# Patient Record
Sex: Female | Born: 1955 | Race: White | Hispanic: No | Marital: Married | Smoking: Never smoker
Health system: Southern US, Community
[De-identification: ages and names within clinical notes are randomized; demographics above are authoritative.]

## PROBLEM LIST (undated history)

## (undated) DIAGNOSIS — D649 Anemia, unspecified: Secondary | ICD-10-CM

## (undated) DIAGNOSIS — H01006 Unspecified blepharitis left eye, unspecified eyelid: Secondary | ICD-10-CM

## (undated) DIAGNOSIS — J069 Acute upper respiratory infection, unspecified: Secondary | ICD-10-CM

## (undated) DIAGNOSIS — J302 Other seasonal allergic rhinitis: Secondary | ICD-10-CM

## (undated) DIAGNOSIS — M6740 Ganglion, unspecified site: Secondary | ICD-10-CM

## (undated) DIAGNOSIS — M5126 Other intervertebral disc displacement, lumbar region: Secondary | ICD-10-CM

## (undated) HISTORY — PX: LASIK: SHX215

## (undated) HISTORY — PX: SINOSCOPY: SHX187

## (undated) HISTORY — DX: Unspecified blepharitis left eye, unspecified eyelid: H01.006

## (undated) HISTORY — PX: TONSILLECTOMY: SUR1361

## (undated) HISTORY — DX: Acute upper respiratory infection, unspecified: J06.9

---

## 1980-10-28 HISTORY — PX: TONSILLECTOMY: SUR1361

## 1996-10-28 HISTORY — PX: ANTERIOR CRUCIATE LIGAMENT REPAIR: SHX115

## 2001-10-13 ENCOUNTER — Other Ambulatory Visit: Admission: RE | Admit: 2001-10-13 | Discharge: 2001-10-13 | Payer: Self-pay | Admitting: Obstetrics & Gynecology

## 2001-10-23 ENCOUNTER — Ambulatory Visit (HOSPITAL_COMMUNITY): Admission: RE | Admit: 2001-10-23 | Discharge: 2001-10-23 | Payer: Self-pay | Admitting: Obstetrics & Gynecology

## 2001-10-23 ENCOUNTER — Encounter: Payer: Self-pay | Admitting: Obstetrics & Gynecology

## 2001-12-22 ENCOUNTER — Other Ambulatory Visit: Admission: RE | Admit: 2001-12-22 | Discharge: 2001-12-22 | Payer: Self-pay | Admitting: Obstetrics & Gynecology

## 2002-06-29 ENCOUNTER — Other Ambulatory Visit: Admission: RE | Admit: 2002-06-29 | Discharge: 2002-06-29 | Payer: Self-pay | Admitting: Obstetrics & Gynecology

## 2002-10-14 ENCOUNTER — Encounter: Payer: Self-pay | Admitting: Neurology

## 2002-10-14 ENCOUNTER — Ambulatory Visit (HOSPITAL_COMMUNITY): Admission: RE | Admit: 2002-10-14 | Discharge: 2002-10-14 | Payer: Self-pay | Admitting: Neurology

## 2002-10-15 ENCOUNTER — Other Ambulatory Visit: Admission: RE | Admit: 2002-10-15 | Discharge: 2002-10-15 | Payer: Self-pay | Admitting: Obstetrics & Gynecology

## 2003-10-25 ENCOUNTER — Other Ambulatory Visit: Admission: RE | Admit: 2003-10-25 | Discharge: 2003-10-25 | Payer: Self-pay | Admitting: Obstetrics & Gynecology

## 2004-03-08 ENCOUNTER — Other Ambulatory Visit: Admission: RE | Admit: 2004-03-08 | Discharge: 2004-03-08 | Payer: Self-pay | Admitting: Obstetrics and Gynecology

## 2004-08-29 ENCOUNTER — Ambulatory Visit (HOSPITAL_COMMUNITY): Admission: RE | Admit: 2004-08-29 | Discharge: 2004-08-29 | Payer: Self-pay | Admitting: Obstetrics and Gynecology

## 2005-11-22 ENCOUNTER — Ambulatory Visit (HOSPITAL_COMMUNITY): Admission: RE | Admit: 2005-11-22 | Discharge: 2005-11-22 | Payer: Self-pay | Admitting: Obstetrics and Gynecology

## 2005-12-13 ENCOUNTER — Encounter: Admission: RE | Admit: 2005-12-13 | Discharge: 2005-12-13 | Payer: Self-pay | Admitting: Obstetrics and Gynecology

## 2010-08-08 ENCOUNTER — Encounter: Admission: RE | Admit: 2010-08-08 | Discharge: 2010-08-08 | Payer: Self-pay | Admitting: Obstetrics and Gynecology

## 2010-11-18 ENCOUNTER — Encounter: Payer: Self-pay | Admitting: Obstetrics and Gynecology

## 2012-06-30 ENCOUNTER — Other Ambulatory Visit (HOSPITAL_COMMUNITY): Payer: Self-pay | Admitting: Obstetrics and Gynecology

## 2012-06-30 DIAGNOSIS — Z1231 Encounter for screening mammogram for malignant neoplasm of breast: Secondary | ICD-10-CM

## 2012-07-10 ENCOUNTER — Ambulatory Visit (HOSPITAL_COMMUNITY)
Admission: RE | Admit: 2012-07-10 | Discharge: 2012-07-10 | Disposition: A | Discharge status: Home / Self Care | Payer: BC Managed Care – PPO | Source: Ambulatory Visit | Attending: Obstetrics and Gynecology | Admitting: Obstetrics and Gynecology

## 2012-07-10 DIAGNOSIS — Z1231 Encounter for screening mammogram for malignant neoplasm of breast: Secondary | ICD-10-CM

## 2013-05-31 ENCOUNTER — Other Ambulatory Visit (HOSPITAL_COMMUNITY): Payer: Self-pay | Admitting: Obstetrics and Gynecology

## 2013-05-31 DIAGNOSIS — Z1231 Encounter for screening mammogram for malignant neoplasm of breast: Secondary | ICD-10-CM

## 2013-06-24 ENCOUNTER — Other Ambulatory Visit (HOSPITAL_COMMUNITY): Payer: Self-pay | Admitting: Obstetrics and Gynecology

## 2013-06-24 DIAGNOSIS — E2839 Other primary ovarian failure: Secondary | ICD-10-CM

## 2013-07-15 ENCOUNTER — Ambulatory Visit (HOSPITAL_COMMUNITY)
Admission: RE | Admit: 2013-07-15 | Discharge: 2013-07-15 | Disposition: A | Discharge status: Home / Self Care | Payer: BC Managed Care – PPO | Source: Ambulatory Visit | Attending: Obstetrics and Gynecology | Admitting: Obstetrics and Gynecology

## 2013-07-15 DIAGNOSIS — Z13820 Encounter for screening for osteoporosis: Secondary | ICD-10-CM | POA: Insufficient documentation

## 2013-07-15 DIAGNOSIS — Z1231 Encounter for screening mammogram for malignant neoplasm of breast: Secondary | ICD-10-CM | POA: Insufficient documentation

## 2013-07-15 DIAGNOSIS — E2839 Other primary ovarian failure: Secondary | ICD-10-CM

## 2013-07-15 DIAGNOSIS — Z780 Asymptomatic menopausal state: Secondary | ICD-10-CM | POA: Insufficient documentation

## 2014-04-15 ENCOUNTER — Encounter: Payer: Self-pay | Admitting: Gastroenterology

## 2014-05-18 ENCOUNTER — Ambulatory Visit (AMBULATORY_SURGERY_CENTER): Payer: Self-pay | Admitting: *Deleted

## 2014-05-18 VITALS — Ht 67.0 in | Wt 168.2 lb

## 2014-05-18 DIAGNOSIS — Z1211 Encounter for screening for malignant neoplasm of colon: Secondary | ICD-10-CM

## 2014-05-18 MED ORDER — MOVIPREP 100 G PO SOLR: ORAL | Status: DC

## 2014-05-18 NOTE — Progress Notes (Signed)
Patient denies any allergies to eggs or soy. Patient denies any problems with anesthesia/sedation. Patient denies any oxygen use at home and does not take any diet/weight loss medications. EMMI education assisgned to patient on colonoscopy, this was explained and instructions given to patient. 

## 2014-05-20 ENCOUNTER — Encounter: Payer: Self-pay | Admitting: Gastroenterology

## 2014-05-30 ENCOUNTER — Encounter: Payer: BC Managed Care – PPO | Admitting: Gastroenterology

## 2014-08-22 ENCOUNTER — Other Ambulatory Visit: Payer: Self-pay | Admitting: Surgical

## 2014-08-22 NOTE — Progress Notes (Signed)
Please put orders in Epic surgery 09-01-14 pre op 08-26-14 Thanks

## 2014-08-23 ENCOUNTER — Encounter (HOSPITAL_COMMUNITY): Payer: Self-pay | Admitting: Pharmacy Technician

## 2014-08-26 ENCOUNTER — Encounter (HOSPITAL_COMMUNITY)
Admission: RE | Admit: 2014-08-26 | Discharge: 2014-08-26 | Disposition: A | Discharge status: Home / Self Care | Payer: BC Managed Care – PPO | Source: Ambulatory Visit | Attending: Orthopedic Surgery | Admitting: Orthopedic Surgery

## 2014-08-26 ENCOUNTER — Encounter (INDEPENDENT_AMBULATORY_CARE_PROVIDER_SITE_OTHER): Payer: Self-pay

## 2014-08-26 ENCOUNTER — Ambulatory Visit (HOSPITAL_COMMUNITY)
Admission: RE | Admit: 2014-08-26 | Discharge: 2014-08-26 | Disposition: A | Discharge status: Home / Self Care | Payer: BC Managed Care – PPO | Source: Ambulatory Visit | Attending: Surgical | Admitting: Surgical

## 2014-08-26 ENCOUNTER — Encounter (HOSPITAL_COMMUNITY): Payer: Self-pay

## 2014-08-26 DIAGNOSIS — Z01810 Encounter for preprocedural cardiovascular examination: Secondary | ICD-10-CM | POA: Insufficient documentation

## 2014-08-26 DIAGNOSIS — M419 Scoliosis, unspecified: Secondary | ICD-10-CM | POA: Insufficient documentation

## 2014-08-26 DIAGNOSIS — R001 Bradycardia, unspecified: Secondary | ICD-10-CM | POA: Diagnosis not present

## 2014-08-26 DIAGNOSIS — M545 Low back pain, unspecified: Secondary | ICD-10-CM

## 2014-08-26 DIAGNOSIS — Z01811 Encounter for preprocedural respiratory examination: Secondary | ICD-10-CM | POA: Diagnosis not present

## 2014-08-26 DIAGNOSIS — Z01812 Encounter for preprocedural laboratory examination: Secondary | ICD-10-CM | POA: Diagnosis not present

## 2014-08-26 HISTORY — DX: Other intervertebral disc displacement, lumbar region: M51.26

## 2014-08-26 HISTORY — DX: Anemia, unspecified: D64.9

## 2014-08-26 HISTORY — DX: Other seasonal allergic rhinitis: J30.2

## 2014-08-26 HISTORY — DX: Ganglion, unspecified site: M67.40

## 2014-08-26 LAB — COMPREHENSIVE METABOLIC PANEL
ALT: 25 U/L (ref 0–35)
AST: 23 U/L (ref 0–37)
Albumin: 3.9 g/dL (ref 3.5–5.2)
Alkaline Phosphatase: 74 U/L (ref 39–117)
Anion gap: 11 (ref 5–15)
BUN: 22 mg/dL (ref 6–23)
CO2: 29 mEq/L (ref 19–32)
Calcium: 9.5 mg/dL (ref 8.4–10.5)
Chloride: 102 mEq/L (ref 96–112)
Creatinine, Ser: 0.91 mg/dL (ref 0.50–1.10)
GFR calc Af Amer: 80 mL/min — ABNORMAL LOW (ref >90)
GFR calc non Af Amer: 69 mL/min — ABNORMAL LOW (ref >90)
Glucose, Bld: 103 mg/dL — ABNORMAL HIGH (ref 70–99)
Potassium: 4.2 mEq/L (ref 3.7–5.3)
Sodium: 142 mEq/L (ref 137–147)
Total Bilirubin: 0.4 mg/dL (ref 0.3–1.2)
Total Protein: 7 g/dL (ref 6.0–8.3)

## 2014-08-26 LAB — CBC
HEMATOCRIT: 39.3 % (ref 36.0–46.0)
HEMOGLOBIN: 12.9 g/dL (ref 12.0–15.0)
MCH: 30.1 pg (ref 26.0–34.0)
MCHC: 32.8 g/dL (ref 30.0–36.0)
MCV: 91.8 fL (ref 78.0–100.0)
Platelets: 217 10*3/uL (ref 150–400)
RBC: 4.28 MIL/uL (ref 3.87–5.11)
RDW: 12.5 % (ref 11.5–15.5)
WBC: 4.8 10*3/uL (ref 4.0–10.5)

## 2014-08-26 LAB — URINALYSIS, ROUTINE W REFLEX MICROSCOPIC
Bilirubin Urine: NEGATIVE
Glucose, UA: NEGATIVE mg/dL
Ketones, ur: NEGATIVE mg/dL
Leukocytes, UA: NEGATIVE
Nitrite: NEGATIVE
Protein, ur: NEGATIVE mg/dL
Specific Gravity, Urine: 1.027 (ref 1.005–1.030)
Urobilinogen, UA: 0.2 mg/dL (ref 0.0–1.0)
pH: 5.5 (ref 5.0–8.0)

## 2014-08-26 LAB — URINE MICROSCOPIC-ADD ON

## 2014-08-26 LAB — SURGICAL PCR SCREEN
MRSA, PCR: NEGATIVE
Staphylococcus aureus: POSITIVE — AB

## 2014-08-26 LAB — APTT: aPTT: 33 seconds (ref 24–37)

## 2014-08-26 LAB — PROTIME-INR
INR: 1 (ref 0.00–1.49)
Prothrombin Time: 13.3 seconds (ref 11.6–15.2)

## 2014-08-26 NOTE — Patient Instructions (Addendum)
Olivia Cole  08/26/2014   Your procedure is scheduled on:   09-01-2014 Thursday  [facility] and [facility]. Arrive at    1:00 PM.  Call this number if you have problems the morning of surgery: [phone]  Or Presurgical Testing [phone].   For Living Will and/or Health Care Power Attorney Forms: please provide copy for your medical record,may bring AM of surgery(Forms should be already notarized -we do not provide this service).(08-26-14 Yes, will provide copies AM of 09-01-14).     Do not eat food/ or drink: After Midnight.  Exception: may have clear liquids:up to 6 Hours before arrival. Nothing after: 0900 AM  Clear liquids include soda, tea, black coffee, apple or grape juice, broth.  Take these medicines the morning of surgery with A SIP OF WATER: Hydrocodone. Tylenol if needed.   Do not wear jewelry, make-up or nail polish.  Do not wear deodorant, lotions, powders, or perfumes.   Do not shave legs and under arms- 48 hours(2 days) prior to first CHG shower.(Shaving face and neck okay.)  Do not bring valuables to the hospital.(Hospital is not responsible for lost valuables).  Contacts, dentures or removable bridgework, body piercing, hair pins may not be worn into surgery.  Leave suitcase in the car. After surgery it may be brought to your room.  For patients admitted to the hospital, checkout time is 11:00 AM the day of discharge.(Restricted visitors-Any Persons displaying flu-like symptoms or illness).    Patients discharged the day of surgery will not be allowed to drive home. Must have responsible person with you x 24 hours once discharged.  Name and phone number of your driver: Aldona BarLewis Pitts, spouse [phone] cell     Please read over the following fact sheets that you were given:  CHG(Chlorhexidine Gluconate 4% Surgical Soap) use, MRSA Information, Incentive Spirometry Instruction.           [place] -  Preparing for Surgery Before surgery, you can play an important role.  Because skin is not sterile, your skin needs to be as free of germs as possible.  You can reduce the number of germs on your skin by washing with CHG (chlorahexidine gluconate) soap before surgery.  CHG is an antiseptic cleaner which kills germs and bonds with the skin to continue killing germs even after washing. Please DO NOT use if you have an allergy to CHG or antibacterial soaps.  If your skin becomes reddened/irritated stop using the CHG and inform your nurse when you arrive at Short Stay. Do not shave (including legs and underarms) for at least 48 hours prior to the first CHG shower.  You may shave your face/neck. Please follow these instructions carefully:  1.  Shower with CHG Soap the night before surgery and the  morning of Surgery.  2.  If you choose to wash your hair, wash your hair first as usual with your  normal  shampoo.  3.  After you shampoo, rinse your hair and body thoroughly to remove the  shampoo.                           4.  Use CHG as you would any other liquid soap.  You can apply chg directly  to the skin and wash  Gently with a scrungie or clean washcloth.  5.  Apply the CHG Soap to your body ONLY FROM THE NECK DOWN.   Do not use on face/ open                           Wound or open sores. Avoid contact with eyes, ears mouth and genitals (private parts).                       Wash face,  Genitals (private parts) with your normal soap.             6.  Wash thoroughly, paying special attention to the area where your surgery  will be performed.  7.  Thoroughly rinse your body with warm water from the neck down.  8.  DO NOT shower/wash with your normal soap after using and rinsing off  the CHG Soap.                9.  Pat yourself dry with a clean towel.            10.  Wear clean pajamas.            11.  Place clean sheets on your bed the night of your first shower and do not  sleep  with pets. Day of Surgery : Do not apply any lotions/deodorants the morning of surgery.  Please wear clean clothes to the hospital/surgery center.  FAILURE TO FOLLOW THESE INSTRUCTIONS MAY RESULT IN THE CANCELLATION OF YOUR SURGERY PATIENT SIGNATURE_________________________________  NURSE SIGNATURE__________________________________  ________________________________________________________________________   Adam Phenix  An incentive spirometer is a tool that can help keep your lungs clear and active. This tool measures how well you are filling your lungs with each breath. Taking long deep breaths may help reverse or decrease the chance of developing breathing (pulmonary) problems (especially infection) following:  A long period of time when you are unable to move or be active. BEFORE THE PROCEDURE   If the spirometer includes an indicator to show your best effort, your nurse or respiratory therapist will set it to a desired goal.  If possible, sit up straight or lean slightly forward. Try not to slouch.  Hold the incentive spirometer in an upright position. INSTRUCTIONS FOR USE  1. Sit on the edge of your bed if possible, or sit up as far as you can in bed or on a chair. 2. Hold the incentive spirometer in an upright position. 3. Breathe out normally. 4. Place the mouthpiece in your mouth and seal your lips tightly around it. 5. Breathe in slowly and as deeply as possible, raising the piston or the ball toward the top of the column. 6. Hold your breath for 3-5 seconds or for as long as possible. Allow the piston or ball to fall to the bottom of the column. 7. Remove the mouthpiece from your mouth and breathe out normally. 8. Rest for a few seconds and repeat Steps 1 through 7 at least 10 times every 1-2 hours when you are awake. Take your time and take a few normal breaths between deep breaths. 9. The spirometer may include an indicator to show your best effort. Use the  indicator as a goal to work toward during each repetition. 10. After each set of 10 deep breaths, practice coughing to be sure your lungs are clear. If you have an incision (the cut made at the time of  surgery), support your incision when coughing by placing a pillow or rolled up towels firmly against it. Once you are able to get out of bed, walk around indoors and cough well. You may stop using the incentive spirometer when instructed by your caregiver.  RISKS AND COMPLICATIONS  Take your time so you do not get dizzy or light-headed.  If you are in pain, you may need to take or ask for pain medication before doing incentive spirometry. It is harder to take a deep breath if you are having pain. AFTER USE  Rest and breathe slowly and easily.  It can be helpful to keep track of a log of your progress. Your caregiver can provide you with a simple table to help with this. If you are using the spirometer at home, follow these instructions: [place] IF:   You are having difficultly using the spirometer.  You have trouble using the spirometer as often as instructed.  Your pain medication is not giving enough relief while using the spirometer.  You develop fever of 100.5 F (38.1 C) or higher. SEEK IMMEDIATE MEDICAL CARE IF:   You cough up bloody sputum that had not been present before.  You develop fever of 102 F (38.9 C) or greater.  You develop worsening pain at or near the incision site. MAKE SURE YOU:   Understand these instructions.  Will watch your condition.  Will get help right away if you are not doing well or get worse. Document Released: 02/24/2007 Document Revised: 01/06/2012 Document Reviewed: 04/27/2007 [facility] Patient Information 2014 [place], [place].   ________________________________________________________________________

## 2014-08-26 NOTE — Pre-Procedure Instructions (Signed)
08-26-14 EKG done today. Back Xray done today.

## 2014-08-26 NOTE — Progress Notes (Signed)
Ua, micro results faxed by epic to dr Darrelyn Hillockgioffre

## 2014-08-30 NOTE — H&P (Signed)
Olivia Cole is an 58 y.o. female.   Chief Complaint: back pain HPI: The patient presented with the chief complaint of low back pain. She has had this pain for about 2 months with no known injury. She developed pain and weakness in the left LE. MRI showed a lumbar disc herniation at L5-S1 on the left.   Past Medical History  Diagnosis Date  . Seasonal allergies     rare  . Ganglion cyst     right anterior hand-no problem  . Anemia     past hx. anemia  . Lumbar herniated disc     L5-S1    Past Surgical History  Procedure Laterality Date  . Tonsillectomy  1982  . Cesarean section  1988  . Anterior cruciate ligament repair Left 1998  . Lasik      Family History  Problem Relation Age of Onset  . Colon cancer Neg Hx    Social History:  reports that she has never smoked. She has never used smokeless tobacco. She reports that she drinks about 4.2 oz of alcohol per week. She reports that she does not use illicit drugs.  Allergies: No Known Allergies   Current outpatient prescriptions:  calcium carbonate (TUMS EX) 750 MG chewable tablet, Chew 1 tablet by mouth every morning. , Disp: , Rfl: ;   Cholecalciferol (VITAMIN D PO), Take 1 tablet by mouth every morning. , Disp: , Rfl: ;   GLUCOSAMINE HCL PO, Take 1 tablet by mouth every morning. , Disp: , Rfl:  HYDROcodone-acetaminophen (NORCO/VICODIN) 5-325 MG per tablet, Take 1 tablet by mouth every 8 (eight) hours as needed for moderate pain., Disp: , Rfl: ;   ibuprofen (ADVIL,MOTRIN) 200 MG tablet, Take 400-600 mg by mouth every 4 (four) hours as needed for headache or mild pain., Disp: , Rfl: ;   Multiple Vitamin (MULTIVITAMIN) tablet, Take 1 tablet by mouth every morning. , Disp: , Rfl:   Review of Systems  Constitutional: Negative.   HENT: Negative.   Eyes: Negative.   Respiratory: Negative.   Cardiovascular: Negative.   Gastrointestinal: Negative.   Genitourinary: Negative.   Musculoskeletal: Positive for back pain.  Negative for myalgias, joint pain, falls and neck pain.  Skin: Negative.   Neurological: Positive for tingling and sensory change. Negative for dizziness, tremors, speech change, focal weakness, seizures and loss of consciousness.  Endo/Heme/Allergies: Negative.   Psychiatric/Behavioral: Negative.     Physical Exam  Constitutional: She is oriented to person, place, and time. She appears well-developed and well-nourished. No distress.  HENT:  Head: Normocephalic and atraumatic.  Right Ear: External ear normal.  Left Ear: External ear normal.  Nose: Nose normal.  Mouth/Throat: Oropharynx is clear and moist.  Eyes: Conjunctivae and EOM are normal.  Neck: Normal range of motion. Neck supple.  Cardiovascular: Normal rate, regular rhythm, normal heart sounds and intact distal pulses.   No murmur heard. Respiratory: Effort normal and breath sounds normal. No respiratory distress. She has no wheezes.  GI: Soft. Bowel sounds are normal. She exhibits no distension. There is no tenderness.  Musculoskeletal:       Right hip: Normal.       Left hip: Normal.       Right knee: Normal.       Left knee: Normal.       Lumbar back: She exhibits decreased range of motion, tenderness, pain and spasm.       Right lower leg: She exhibits no tenderness and no swelling.  Left lower leg: She exhibits no tenderness and no swelling.  Neurological: She is alert and oriented to person, place, and time. She has normal reflexes. A sensory deficit is present.  Positive SLR on the left. She has weakness of her dorsiflexors and toe extensors of her left foot.  Skin: No rash noted. She is not diaphoretic. No erythema.  Psychiatric: She has a normal mood and affect. Her behavior is normal.     Assessment/Plan Lumbar disc herniation L5-S1 left She needs a lumbar hemilaminectomy and microdiscectomy L5-S1 left. The possible complications of spinal surgery number one could be infection, which is extremely rare. We  do use antibiotics prior to the surgery and during surgery and after surgery. Number two is always a slight degree of probability that you could develop a blood clot in your leg after any type of surgery and we try our best to prevent that with aspirin post op when it is safe to begin. The third is a dural leak. That is the spinal fluid leak that could occur. At certain rare times the bone or the disc could literally stick to the dura which is the lining which contains the spinal fluid and we could develop a small tear in that lining which we then patch up. That is an extremely rare complication. The last and final complication is a recurrent disc rupture. That means that you could rupture another small piece of disc later on down the road and there is about a 2% chance of that.  H&P performed by Dr. Ranee Gosselinonald Gioffre H&P documented by Olivia PedAmber Persis Graffius, PA-C   Olivia Cole Olivia Cole 08/30/2014, 8:42 AM

## 2014-09-01 ENCOUNTER — Observation Stay (HOSPITAL_COMMUNITY)
Admission: RE | Admit: 2014-09-01 | Discharge: 2014-09-02 | Disposition: A | Discharge status: Home / Self Care | Payer: BC Managed Care – PPO | Source: Ambulatory Visit | Attending: Orthopedic Surgery | Admitting: Orthopedic Surgery

## 2014-09-01 ENCOUNTER — Ambulatory Visit (HOSPITAL_COMMUNITY): Payer: BC Managed Care – PPO

## 2014-09-01 ENCOUNTER — Encounter (HOSPITAL_COMMUNITY): Payer: Self-pay | Admitting: Certified Registered Nurse Anesthetist

## 2014-09-01 ENCOUNTER — Ambulatory Visit (HOSPITAL_COMMUNITY): Payer: BC Managed Care – PPO | Admitting: Anesthesiology

## 2014-09-01 ENCOUNTER — Encounter (HOSPITAL_COMMUNITY)
Admission: RE | Disposition: A | Discharge status: Home / Self Care | Payer: Self-pay | Source: Ambulatory Visit | Attending: Orthopedic Surgery

## 2014-09-01 DIAGNOSIS — M5127 Other intervertebral disc displacement, lumbosacral region: Secondary | ICD-10-CM | POA: Diagnosis not present

## 2014-09-01 DIAGNOSIS — M5126 Other intervertebral disc displacement, lumbar region: Secondary | ICD-10-CM

## 2014-09-01 HISTORY — PX: LUMBAR LAMINECTOMY/DECOMPRESSION MICRODISCECTOMY: SHX5026

## 2014-09-01 SURGERY — LUMBAR LAMINECTOMY/DECOMPRESSION MICRODISCECTOMY
Anesthesia: General | Site: Back | Laterality: Left

## 2014-09-01 MED ORDER — PHENYLEPHRINE HCL 10 MG/ML IJ SOLN
INTRAMUSCULAR | Status: AC
  Filled 2014-09-01: qty 1

## 2014-09-01 MED ORDER — MUPIROCIN 2 % EX OINT
1.0000 "application " | TOPICAL_OINTMENT | Freq: Two times a day (BID) | CUTANEOUS | Status: DC
  Administered 2014-09-01: 1 via NASAL

## 2014-09-01 MED ORDER — SENNOSIDES-DOCUSATE SODIUM 8.6-50 MG PO TABS: 1.0000 | ORAL_TABLET | Freq: Every evening | ORAL | Status: DC | PRN

## 2014-09-01 MED ORDER — CEFAZOLIN SODIUM-DEXTROSE 2-3 GM-% IV SOLR
2.0000 g | INTRAVENOUS | Status: AC
Start: 2014-09-02 — End: 2014-09-01
  Administered 2014-09-01: 2 g via INTRAVENOUS

## 2014-09-01 MED ORDER — BACITRACIN-NEOMYCIN-POLYMYXIN 400-5-5000 EX OINT
TOPICAL_OINTMENT | CUTANEOUS | Status: AC
  Filled 2014-09-01: qty 1

## 2014-09-01 MED ORDER — BISACODYL 5 MG PO TBEC: 5.0000 mg | DELAYED_RELEASE_TABLET | Freq: Every day | ORAL | Status: DC | PRN

## 2014-09-01 MED ORDER — ONDANSETRON HCL 4 MG/2ML IJ SOLN: 4.0000 mg | INTRAMUSCULAR | Status: DC | PRN

## 2014-09-01 MED ORDER — SODIUM CHLORIDE 0.9 % IJ SOLN
INTRAMUSCULAR | Status: AC
  Filled 2014-09-01: qty 10

## 2014-09-01 MED ORDER — HYDROMORPHONE HCL 1 MG/ML IJ SOLN
INTRAMUSCULAR | Status: AC
  Filled 2014-09-01: qty 1

## 2014-09-01 MED ORDER — BACITRACIN-NEOMYCIN-POLYMYXIN 400-5-5000 EX OINT
TOPICAL_OINTMENT | CUTANEOUS | Status: DC | PRN
  Administered 2014-09-01: 1 via TOPICAL

## 2014-09-01 MED ORDER — METHOCARBAMOL 500 MG PO TABS
500.0000 mg | ORAL_TABLET | Freq: Four times a day (QID) | ORAL | Status: DC | PRN
  Administered 2014-09-02 (×2): 500 mg via ORAL
  Filled 2014-09-01 (×2): qty 1

## 2014-09-01 MED ORDER — MIDAZOLAM HCL 5 MG/5ML IJ SOLN
INTRAMUSCULAR | Status: DC | PRN
  Administered 2014-09-01: 2 mg via INTRAVENOUS

## 2014-09-01 MED ORDER — ACETAMINOPHEN 10 MG/ML IV SOLN
1000.0000 mg | Freq: Once | INTRAVENOUS | Status: AC
  Administered 2014-09-01: 1000 mg via INTRAVENOUS
  Filled 2014-09-01: qty 100

## 2014-09-01 MED ORDER — FENTANYL CITRATE 0.05 MG/ML IJ SOLN
INTRAMUSCULAR | Status: AC
  Filled 2014-09-01: qty 5

## 2014-09-01 MED ORDER — CEFAZOLIN SODIUM 1-5 GM-% IV SOLN
1.0000 g | Freq: Three times a day (TID) | INTRAVENOUS | Status: AC
  Administered 2014-09-01 – 2014-09-02 (×3): 1 g via INTRAVENOUS
  Filled 2014-09-01 (×3): qty 50

## 2014-09-01 MED ORDER — ONDANSETRON HCL 4 MG/2ML IJ SOLN: 4.0000 mg | Freq: Once | INTRAMUSCULAR | Status: DC | PRN

## 2014-09-01 MED ORDER — GLYCOPYRROLATE 0.2 MG/ML IJ SOLN
INTRAMUSCULAR | Status: DC | PRN
  Administered 2014-09-01: 0.2 mg via INTRAVENOUS
  Administered 2014-09-01: 0.6 mg via INTRAVENOUS

## 2014-09-01 MED ORDER — THROMBIN 5000 UNITS EX SOLR
CUTANEOUS | Status: AC
  Filled 2014-09-01: qty 10000

## 2014-09-01 MED ORDER — ROCURONIUM BROMIDE 100 MG/10ML IV SOLN
INTRAVENOUS | Status: DC | PRN
  Administered 2014-09-01: 10 mg via INTRAVENOUS
  Administered 2014-09-01: 20 mg via INTRAVENOUS

## 2014-09-01 MED ORDER — FENTANYL CITRATE 0.05 MG/ML IJ SOLN
INTRAMUSCULAR | Status: DC | PRN
  Administered 2014-09-01: 50 ug via INTRAVENOUS
  Administered 2014-09-01: 100 ug via INTRAVENOUS
  Administered 2014-09-01 (×2): 50 ug via INTRAVENOUS

## 2014-09-01 MED ORDER — CEFAZOLIN SODIUM-DEXTROSE 2-3 GM-% IV SOLR
INTRAVENOUS | Status: AC
  Filled 2014-09-01: qty 50

## 2014-09-01 MED ORDER — PHENYLEPHRINE HCL 10 MG/ML IJ SOLN
INTRAMUSCULAR | Status: DC | PRN
  Administered 2014-09-01 (×2): 40 ug via INTRAVENOUS

## 2014-09-01 MED ORDER — HYDROMORPHONE HCL 1 MG/ML IJ SOLN
0.2500 mg | INTRAMUSCULAR | Status: DC | PRN
  Administered 2014-09-01: 0.25 mg via INTRAVENOUS

## 2014-09-01 MED ORDER — MIDAZOLAM HCL 2 MG/2ML IJ SOLN
INTRAMUSCULAR | Status: AC
  Filled 2014-09-01: qty 2

## 2014-09-01 MED ORDER — EPHEDRINE SULFATE 50 MG/ML IJ SOLN
INTRAMUSCULAR | Status: DC | PRN
  Administered 2014-09-01: 10 mg via INTRAVENOUS

## 2014-09-01 MED ORDER — HYDROMORPHONE HCL 1 MG/ML IJ SOLN: 0.5000 mg | INTRAMUSCULAR | Status: DC | PRN

## 2014-09-01 MED ORDER — PHENYLEPHRINE HCL 10 MG/ML IJ SOLN: 10.0000 mg | INTRAVENOUS | Status: DC | PRN

## 2014-09-01 MED ORDER — ROCURONIUM BROMIDE 100 MG/10ML IV SOLN
INTRAVENOUS | Status: AC
  Filled 2014-09-01: qty 1

## 2014-09-01 MED ORDER — LIDOCAINE HCL (CARDIAC) 20 MG/ML IV SOLN
INTRAVENOUS | Status: DC | PRN
  Administered 2014-09-01: 100 mg via INTRAVENOUS

## 2014-09-01 MED ORDER — FLEET ENEMA 7-19 GM/118ML RE ENEM: 1.0000 | ENEMA | Freq: Once | RECTAL | Status: AC | PRN

## 2014-09-01 MED ORDER — NEOSTIGMINE METHYLSULFATE 10 MG/10ML IV SOLN
INTRAVENOUS | Status: DC | PRN
  Administered 2014-09-01: 5 mg via INTRAVENOUS

## 2014-09-01 MED ORDER — METHOCARBAMOL 500 MG PO TABS: 500.0000 mg | ORAL_TABLET | Freq: Four times a day (QID) | ORAL | Status: DC | PRN

## 2014-09-01 MED ORDER — SODIUM CHLORIDE 0.9 % IR SOLN
Status: AC
  Filled 2014-09-01: qty 1

## 2014-09-01 MED ORDER — ESMOLOL HCL 10 MG/ML IV SOLN
INTRAVENOUS | Status: AC
  Filled 2014-09-01: qty 10

## 2014-09-01 MED ORDER — HYDROMORPHONE HCL 1 MG/ML IJ SOLN: 0.2500 mg | INTRAMUSCULAR | Status: DC | PRN

## 2014-09-01 MED ORDER — PROPOFOL 10 MG/ML IV BOLUS
INTRAVENOUS | Status: AC
  Filled 2014-09-01: qty 20

## 2014-09-01 MED ORDER — PHENOL 1.4 % MT LIQD: 1.0000 | OROMUCOSAL | Status: DC | PRN

## 2014-09-01 MED ORDER — OXYCODONE HCL 5 MG/5ML PO SOLN: 5.0000 mg | Freq: Once | ORAL | Status: DC | PRN

## 2014-09-01 MED ORDER — HYDROCODONE-ACETAMINOPHEN 5-325 MG PO TABS
1.0000 | ORAL_TABLET | Freq: Three times a day (TID) | ORAL | Status: DC | PRN
  Administered 2014-09-02 (×2): 1 via ORAL
  Filled 2014-09-01 (×2): qty 1

## 2014-09-01 MED ORDER — LACTATED RINGERS IV SOLN: INTRAVENOUS | Status: DC

## 2014-09-01 MED ORDER — OXYCODONE HCL 5 MG PO TABS
5.0000 mg | ORAL_TABLET | Freq: Once | ORAL | Status: DC | PRN
Start: 2014-09-01 — End: 2014-09-01

## 2014-09-01 MED ORDER — SODIUM CHLORIDE 0.9 % IR SOLN
Status: DC | PRN
  Administered 2014-09-01: 500 mL

## 2014-09-01 MED ORDER — OXYCODONE-ACETAMINOPHEN 5-325 MG PO TABS: 1.0000 | ORAL_TABLET | ORAL | Status: DC | PRN

## 2014-09-01 MED ORDER — GLYCOPYRROLATE 0.2 MG/ML IJ SOLN
INTRAMUSCULAR | Status: AC
  Filled 2014-09-01: qty 3

## 2014-09-01 MED ORDER — LIDOCAINE HCL (CARDIAC) 20 MG/ML IV SOLN
INTRAVENOUS | Status: AC
  Filled 2014-09-01: qty 5

## 2014-09-01 MED ORDER — METHOCARBAMOL 1000 MG/10ML IJ SOLN
500.0000 mg | Freq: Four times a day (QID) | INTRAVENOUS | Status: DC | PRN
  Administered 2014-09-01: 500 mg via INTRAVENOUS
  Filled 2014-09-01 (×2): qty 5

## 2014-09-01 MED ORDER — BUPIVACAINE-EPINEPHRINE 0.5% -1:200000 IJ SOLN
INTRAMUSCULAR | Status: AC
  Filled 2014-09-01: qty 1

## 2014-09-01 MED ORDER — BUPIVACAINE LIPOSOME 1.3 % IJ SUSP
20.0000 mL | Freq: Once | INTRAMUSCULAR | Status: DC
  Filled 2014-09-01: qty 20

## 2014-09-01 MED ORDER — SUCCINYLCHOLINE CHLORIDE 20 MG/ML IJ SOLN
INTRAMUSCULAR | Status: DC | PRN
  Administered 2014-09-01: 100 mg via INTRAVENOUS

## 2014-09-01 MED ORDER — MENTHOL 3 MG MT LOZG
1.0000 | LOZENGE | OROMUCOSAL | Status: DC | PRN
  Filled 2014-09-01: qty 9

## 2014-09-01 MED ORDER — PROPOFOL 10 MG/ML IV BOLUS
INTRAVENOUS | Status: DC | PRN
  Administered 2014-09-01: 150 mg via INTRAVENOUS

## 2014-09-01 MED ORDER — ESMOLOL HCL 10 MG/ML IV SOLN
INTRAVENOUS | Status: DC | PRN
  Administered 2014-09-01: 10 mg via INTRAVENOUS

## 2014-09-01 MED ORDER — ACETAMINOPHEN 325 MG PO TABS: 650.0000 mg | ORAL_TABLET | ORAL | Status: DC | PRN

## 2014-09-01 MED ORDER — MEPERIDINE HCL 50 MG/ML IJ SOLN: 6.2500 mg | INTRAMUSCULAR | Status: DC | PRN

## 2014-09-01 MED ORDER — BUPIVACAINE-EPINEPHRINE 0.5% -1:200000 IJ SOLN
INTRAMUSCULAR | Status: DC | PRN
  Administered 2014-09-01: 20 mL

## 2014-09-01 MED ORDER — PROMETHAZINE HCL 25 MG/ML IJ SOLN: 6.2500 mg | INTRAMUSCULAR | Status: DC | PRN

## 2014-09-01 MED ORDER — ONDANSETRON HCL 4 MG/2ML IJ SOLN
INTRAMUSCULAR | Status: DC | PRN
  Administered 2014-09-01: 4 mg via INTRAVENOUS

## 2014-09-01 MED ORDER — LACTATED RINGERS IV SOLN
INTRAVENOUS | Status: DC
  Administered 2014-09-01 – 2014-09-02 (×3): via INTRAVENOUS

## 2014-09-01 MED ORDER — NEOSTIGMINE METHYLSULFATE 10 MG/10ML IV SOLN
INTRAVENOUS | Status: AC
  Filled 2014-09-01: qty 1

## 2014-09-01 MED ORDER — ACETAMINOPHEN 650 MG RE SUPP: 650.0000 mg | RECTAL | Status: DC | PRN

## 2014-09-01 MED ORDER — BUPIVACAINE LIPOSOME 1.3 % IJ SUSP
INTRAMUSCULAR | Status: DC | PRN
  Administered 2014-09-01: 20 mL

## 2014-09-01 MED ORDER — EPHEDRINE SULFATE 50 MG/ML IJ SOLN
INTRAMUSCULAR | Status: AC
  Filled 2014-09-01: qty 1

## 2014-09-01 MED ORDER — PHENYLEPHRINE 40 MCG/ML (10ML) SYRINGE FOR IV PUSH (FOR BLOOD PRESSURE SUPPORT)
PREFILLED_SYRINGE | INTRAVENOUS | Status: AC
  Filled 2014-09-01: qty 10

## 2014-09-01 MED ORDER — OXYCODONE-ACETAMINOPHEN 5-325 MG PO TABS
1.0000 | ORAL_TABLET | ORAL | Status: DC | PRN
  Administered 2014-09-02: 1 via ORAL
  Filled 2014-09-01: qty 1

## 2014-09-01 MED ORDER — THROMBIN 5000 UNITS EX SOLR
CUTANEOUS | Status: DC | PRN
  Administered 2014-09-01: 10000 [IU] via TOPICAL

## 2014-09-01 MED ORDER — ONDANSETRON HCL 4 MG/2ML IJ SOLN
INTRAMUSCULAR | Status: AC
  Filled 2014-09-01: qty 2

## 2014-09-01 SURGICAL SUPPLY — 45 items
APL SKNCLS STERI-STRIP NONHPOA (GAUZE/BANDAGES/DRESSINGS) ×1
BAG SPEC THK2 15X12 ZIP CLS (MISCELLANEOUS) ×1
BAG ZIPLOCK 12X15 (MISCELLANEOUS) ×2 IMPLANT
BENZOIN TINCTURE PRP APPL 2/3 (GAUZE/BANDAGES/DRESSINGS) ×2 IMPLANT
BLADE SURG SZ10 CARB STEEL (BLADE) IMPLANT
CLEANER TIP ELECTROSURG 2X2 (MISCELLANEOUS) ×2 IMPLANT
DRAIN PENROSE 18X1/4 LTX STRL (WOUND CARE) IMPLANT
DRAPE MICROSCOPE LEICA (MISCELLANEOUS) ×2 IMPLANT
DRAPE POUCH INSTRU U-SHP 10X18 (DRAPES) ×2 IMPLANT
DRAPE SURG 17X11 SM STRL (DRAPES) ×2 IMPLANT
DRSG ADAPTIC 3X8 NADH LF (GAUZE/BANDAGES/DRESSINGS) ×2 IMPLANT
DRSG PAD ABDOMINAL 8X10 ST (GAUZE/BANDAGES/DRESSINGS) ×5 IMPLANT
DURAPREP 26ML APPLICATOR (WOUND CARE) ×2 IMPLANT
ELECT BLADE TIP CTD 4 INCH (ELECTRODE) ×2 IMPLANT
ELECT REM PT RETURN 9FT ADLT (ELECTROSURGICAL) ×2
ELECTRODE REM PT RTRN 9FT ADLT (ELECTROSURGICAL) ×1 IMPLANT
GAUZE SPONGE 4X4 12PLY STRL (GAUZE/BANDAGES/DRESSINGS) ×1 IMPLANT
GLOVE BIOGEL PI IND STRL 8 (GLOVE) ×2 IMPLANT
GLOVE BIOGEL PI INDICATOR 8 (GLOVE) ×2
GLOVE ECLIPSE 8.0 STRL XLNG CF (GLOVE) ×4 IMPLANT
GOWN STRL REUS W/TWL LRG LVL3 (GOWN DISPOSABLE) ×6 IMPLANT
GOWN STRL REUS W/TWL XL LVL3 (GOWN DISPOSABLE) ×4 IMPLANT
KIT BASIN OR (CUSTOM PROCEDURE TRAY) ×2 IMPLANT
KIT POSITIONING SURG ANDREWS (MISCELLANEOUS) ×2 IMPLANT
MANIFOLD NEPTUNE II (INSTRUMENTS) ×2 IMPLANT
NDL SPNL 18GX3.5 QUINCKE PK (NEEDLE) ×2 IMPLANT
NEEDLE SPNL 18GX3.5 QUINCKE PK (NEEDLE) ×4 IMPLANT
NS IRRIG 1000ML POUR BTL (IV SOLUTION) ×2 IMPLANT
PACK LAMINECTOMY ORTHO (CUSTOM PROCEDURE TRAY) ×2 IMPLANT
PATTIES SURGICAL .5 X.5 (GAUZE/BANDAGES/DRESSINGS) IMPLANT
PATTIES SURGICAL .75X.75 (GAUZE/BANDAGES/DRESSINGS) IMPLANT
PATTIES SURGICAL 1X1 (DISPOSABLE) IMPLANT
PIN SAFETY NICK PLATE  2 MED (MISCELLANEOUS)
PIN SAFETY NICK PLATE 2 MED (MISCELLANEOUS) IMPLANT
POSITIONER SURGICAL ARM (MISCELLANEOUS) ×2 IMPLANT
SPONGE LAP 4X18 X RAY DECT (DISPOSABLE) IMPLANT
SPONGE SURGIFOAM ABS GEL 100 (HEMOSTASIS) ×2 IMPLANT
STAPLER VISISTAT 35W (STAPLE) IMPLANT
SUT VIC AB 0 CT1 27 (SUTURE) ×2
SUT VIC AB 0 CT1 27XBRD ANTBC (SUTURE) ×1 IMPLANT
SUT VIC AB 1 CT1 27 (SUTURE) ×8
SUT VIC AB 1 CT1 27XBRD ANTBC (SUTURE) ×4 IMPLANT
TAPE CLOTH SURG 6X10 WHT LF (GAUZE/BANDAGES/DRESSINGS) ×1 IMPLANT
TOWEL OR 17X26 10 PK STRL BLUE (TOWEL DISPOSABLE) ×4 IMPLANT
WATER STERILE IRR 1500ML POUR (IV SOLUTION) ×2 IMPLANT

## 2014-09-01 NOTE — Op Note (Signed)
NAMCandie Mile:  Riemann, Derek             ACCOUNT NO.:  [id number]  MEDICAL RECORD NO.:  [id number]  LOCATION:  1607                         [facility]  PHYSICIAN:  Georges Lynchonald A. Rylen Swindler, M.D.DATE OF BIRTH:  1956-06-07  DATE OF PROCEDURE:  09/01/2014 DATE OF DISCHARGE:                              OPERATIVE REPORT   PREOPERATIVE DIAGNOSES: 1. 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 2. Large herniated disk at L5-S1 on the left.  POSTOPERATIVE DIAGNOSES: 1. 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 2. Large herniated disk at L5-S1 on the left.  OPERATION: 1. Hemilaminectomy and microdiskectomy at L5-S1 on the left. 2. Decompression of the lateral recess at L5-S1 on the left. 3. Foraminotomy for the S1 root on the left.  SURGEON:  Georges Lynchonald A. Darrelyn HillockGioffre, M.D.  ASSISTANT:  Marlowe KaysJames Aplington, M.D.  DESCRIPTION OF PROCEDURE:  Under general anesthesia, routine orthopedic prepping and draping of the lower back was carried out.  The appropriate time-out was first carried out.  I also marked the appropriate left side of her back in the holding area.  All her leg pain was on the left only. At this time, 2 needles were placed in the back after the time-out x-ray was taken to verify the position.  Incision then was made after I injected 20 mL of 0.5% Marcaine with epinephrine on both sides of the spinous process to control the bleeding.  Incision was made down to the L5-S1 area and we extended it proximally.  Bleeders were identified and cauterized.  I then separated the muscle from the lamina and spinous processes bilaterally and another x-ray was taken.  Following that, I then inserted my McCullough retractors and carried out my hemilaminectomy in both directions.  Went out far lateral to decompress the lateral recess, which was extremely tight.  A microscope was brought in.  At this particular time, we gently removed the ligamentum flavum with great care taken to protect the underlying  dura and the S1 root. The S1 root was extremely swollen.  There was extremely large herniated disk.  We gently irrigated the area out, inserted some thrombin-soaked Gelfoam to get good control of the bleeding.  We then did a nice foraminotomy for the S1 root.  Since it was quite swollen.  We gently retracted the root, made a cruciate incision in the posterior longitudinal ligament, and did a microdiskectomy.  Since we made the incision in the posterior longitudinal ligament, the disk literally exploded out.  We utilized nerve hook and the Epstein curettes to complete the diskectomy.  Following this, we irrigated the area, reinspected the dura and that was free.  We were able to easily pass a hockey-stick distally out the S1 root, also proximally for the L5 root. The recess was nicely decompressed.  I thoroughly irrigated out the area loosely, applied some thrombin-soaked Gelfoam, closed the wound in layers in usual fashion except I left a small distal and proximal deep part of the wound open for drainage purposes.  I injected 20 mL of Exparel at the end of the procedure into the muscle and subcu.  The subcu was closed with #1 Vicryl.  The skin was closed with metal staples.  Sterile Neosporin dressing was applied. She had 2 g of IV Ancef preop.          ______________________________ Georges Lynchonald A. Darrelyn HillockGioffre, M.D.     RAG/MEDQ  D:  09/01/2014  T:  09/01/2014  Job:  [id number]

## 2014-09-01 NOTE — Anesthesia Postprocedure Evaluation (Signed)
Anesthesia Post Note  Patient: Olivia Cole  Procedure(s) Performed: Procedure(s) (LRB): HEMI LAMINECTOMY MICRODISCECTOMY L5-S1 LEFT  (Left)  Anesthesia type: General  Patient location: PACU  Post pain: Pain level controlled  Post assessment: Post-op Vital signs reviewed  Last Vitals: BP 152/86 mmHg  Pulse 77  Temp(Src) 36.3 C (Oral)  Resp 16  SpO2 100%  Post vital signs: Reviewed  Level of consciousness: sedated  Complications: No apparent anesthesia complications

## 2014-09-01 NOTE — Anesthesia Preprocedure Evaluation (Addendum)
Anesthesia Evaluation  Patient identified by MRN, date of birth, ID band Patient awake    Reviewed: Allergy & Precautions, H&P , NPO status , Patient's Chart, lab work & pertinent test results  History of Anesthesia Complications Negative for: history of anesthetic complications  Airway Mallampati: II  TM Distance: >3 FB Neck ROM: Full    Dental no notable dental hx.    Pulmonary neg pulmonary ROS,  breath sounds clear to auscultation  Pulmonary exam normal       Cardiovascular Exercise Tolerance: Good negative cardio ROS  Rhythm:Regular Rate:Normal     Neuro/Psych negative neurological ROS  negative psych ROS   GI/Hepatic negative GI ROS, Neg liver ROS,   Endo/Other  negative endocrine ROS  Renal/GU negative Renal ROS  negative genitourinary   Musculoskeletal negative musculoskeletal ROS (+)   Abdominal   Peds negative pediatric ROS (+)  Hematology negative hematology ROS (+)   Anesthesia Other Findings   Reproductive/Obstetrics negative OB ROS                             Anesthesia Physical Anesthesia Plan  ASA: I  Anesthesia Plan: General   Post-op Pain Management:    Induction: Intravenous  Airway Management Planned: Oral ETT  Additional Equipment:   Intra-op Plan:   Post-operative Plan: Extubation in OR  Informed Consent: I have reviewed the patients History and Physical, chart, labs and discussed the procedure including the risks, benefits and alternatives for the proposed anesthesia with the patient or authorized representative who has indicated his/her understanding and acceptance.   Dental advisory given  Plan Discussed with: CRNA  Anesthesia Plan Comments:         Anesthesia Quick Evaluation

## 2014-09-01 NOTE — Interval H&P Note (Signed)
History and Physical Interval Note:  09/01/2014 3:19 PM  Olivia Cole  has presented today for surgery, with the diagnosis of HERNIATED DISC   The various methods of treatment have been discussed with the patient and family. After consideration of risks, benefits and other options for treatment, the patient has consented to  Procedure(s): HEMI LAMINECTOMY MICRODISCECTOMY L5-S1 LEFT  (Left) as a surgical intervention .  The patient's history has been reviewed, patient examined, no change in status, stable for surgery.  I have reviewed the patient's chart and labs.  Questions were answered to the patient's satisfaction.     Jakaleb Payer A

## 2014-09-01 NOTE — Plan of Care (Signed)
Problem: Phase I Progression Outcomes Goal: OOB as tolerated unless otherwise ordered Outcome: Completed/Met Date Met:  09/01/14     

## 2014-09-01 NOTE — Transfer of Care (Signed)
Immediate Anesthesia Transfer of Care Note  Patient: Olivia Cole  Procedure(s) Performed: Procedure(s) (LRB): HEMI LAMINECTOMY MICRODISCECTOMY L5-S1 LEFT  (Left)  Patient Location: PACU  Anesthesia Type: General  Level of Consciousness: sedated, patient cooperative and responds to stimulation  Airway & Oxygen Therapy: Patient Spontanous Breathing and Patient connected to face mask oxgen  Post-op Assessment: Report given to PACU RN and Post -op Vital signs reviewed and stable  Post vital signs: Reviewed and stable  Complications: No apparent anesthesia complications

## 2014-09-01 NOTE — Plan of Care (Signed)
Problem: Phase I Progression Outcomes Goal: Pain controlled with appropriate interventions Outcome: Completed/Met Date Met:  09/01/14

## 2014-09-01 NOTE — Plan of Care (Signed)
Problem: Phase I Progression Outcomes Goal: Hemodynamically stable Outcome: Completed/Met Date Met:  09/01/14     

## 2014-09-01 NOTE — Plan of Care (Signed)
Problem: Phase I Progression Outcomes Goal: Log roll for position change Outcome: Completed/Met Date Met:  09/01/14

## 2014-09-01 NOTE — Brief Op Note (Signed)
09/01/2014  5:43 PM  PATIENT:  Olivia Cole  58 y.o. female  PRE-OPERATIVE DIAGNOSIS:  HERNIATED DISC ,Lumbar at L-5-S-1 on the left and Lateral Recess Stenosis.  POST-OPERATIVE DIAGNOSIS:Same as Pre-op  PROCEDURE:  Procedure(s): HEMI LAMINECTOMY MICRODISCECTOMY L5-S1 LEFT  (Left) and Decompression of Lateral Recess on the Left.  SURGEON:  Surgeon(s) and Role:    * Jacki Conesonald A Jasmeet Gehl, MD - Primary    * Drucilla SchmidtJames P Aplington, MD - Assisting  :   ASSISTANTS:James Aplington MD  ANESTHESIA:   general  EBL:  Total I/O In: 1000 [I.V.:1000] Out: 25 [Blood:25]  BLOOD ADMINISTERED:none  DRAINS: none   LOCAL MEDICATIONS USED:  MARCAINE 20cc of 0.50% with Epinephrine at start of case and 20cc of Exparel at the end of the case.    SPECIMEN:  Source of Specimen:  L-5-S-1 on the Left.  DISPOSITION OF SPECIMEN:  PATHOLOGY  COUNTS:  YES  TOURNIQUET:  * No tourniquets in log *  DICTATION: .Other Dictation: Dictation Number [id number]  PLAN OF CARE: Admit for overnight observation  PATIENT DISPOSITION:  PACU - hemodynamically stable.   Delay start of Pharmacological VTE agent (>24hrs) due to surgical blood loss or risk of bleeding: yes

## 2014-09-01 NOTE — Discharge Instructions (Signed)
Change your dressing daily. °Shower only, no tub bath. °Call if any temperatures greater than 101 or any wound complications: [phone] during the day and ask for Dr. Gioffre's nurse, Tammy Johnson. °

## 2014-09-02 ENCOUNTER — Other Ambulatory Visit: Payer: Self-pay | Admitting: Orthopedic Surgery

## 2014-09-02 ENCOUNTER — Encounter (HOSPITAL_COMMUNITY): Payer: Self-pay | Admitting: Orthopedic Surgery

## 2014-09-02 DIAGNOSIS — M5127 Other intervertebral disc displacement, lumbosacral region: Secondary | ICD-10-CM | POA: Diagnosis not present

## 2014-09-02 NOTE — Evaluation (Signed)
Occupational Therapy Evaluation Patient Details Name: Olivia Cole MRN: [id number] DOB: 1956/05/29 Today's Date: 09/02/2014    History of Present Illness s/p hemilaminectomy, microdiskectomy, decompression L5-S1   Clinical Impression   This 58 year old female was admitted for the above surgery.  OT will return later today to further educate and assess bathroom transfers as pt was limited by lightheadedness at time of evaluation.      Follow Up Recommendations  No OT follow up    Equipment Recommendations   (to be further assessed for 3;1)    Recommendations for Other Services       Precautions / Restrictions Precautions Precautions: Back Restrictions Weight Bearing Restrictions: No      Mobility Bed Mobility Overal bed mobility: Needs Assistance Bed Mobility: Sidelying to Sit;Supine to Sit   Sidelying to sit: Supervision Supine to sit: Supervision     General bed mobility comments: used bed rail lying back down.    Transfers                      Balance                                            ADL Overall ADL's : Needs assistance/impaired                                       General ADL Comments: pt sat EOB and OT educated on alternative ways to perform ADLs following back precautions.  Pt likely to do best from supine position or to have husband assist her.  She had questions about sitting vs. lying down, when she can resume exercises:  told her to check with Dr Darrelyn HillockGioffre about timing to resume activities.  Demonstrated toilet aide--pt not interested in other AE.  Pt is overall set up for UB adls and mod A for LB adls.  She needed to lie back down due to lightheadedness.  Will return for toilet transfer/DME assessment.  Demonstrated sitting backwards on toilet, if needed     Vision                     Perception     Praxis      Pertinent Vitals/Pain       Hand Dominance     Extremity/Trunk  Assessment Upper Extremity Assessment Upper Extremity Assessment: Overall WFL for tasks assessed           Communication Communication Communication: No difficulties   Cognition Arousal/Alertness: Awake/alert Behavior During Therapy: WFL for tasks assessed/performed Overall Cognitive Status: Within Functional Limits for tasks assessed                     General Comments       Exercises       Shoulder Instructions      Home Living Family/patient expects to be discharged to:: Private residence Living Arrangements: Spouse/significant other                 Bathroom Shower/Tub: Producer, television/film/videoWalk-in shower   Bathroom Toilet: Standard                Prior Functioning/Environment Level of Independence: Independent             OT Diagnosis: Generalized  weakness   OT Problem List: Decreased activity tolerance;Decreased knowledge of precautions;Decreased knowledge of use of DME or AE;Pain   OT Treatment/Interventions: Self-care/ADL training;DME and/or AE instruction;Patient/family education    OT Goals(Current goals can be found in the care plan section) Acute Rehab OT Goals Patient Stated Goal: get back to work, bicycling OT Goal Formulation: With patient/family Time For Goal Achievement: 09/09/14 Potential to Achieve Goals: Good ADL Goals Pt Will Transfer to Toilet: with supervision;regular height toilet;bedside commode;ambulating Pt Will Perform Tub/Shower Transfer: with supervision;ambulating  OT Frequency: Min 2X/week   Barriers to D/C:            Co-evaluation              End of Session    Activity Tolerance:  (lightheaded) Patient left: in bed;with call bell/phone within reach;with family/visitor present;with nursing/sitter in room   Time: 0912-0928 OT Time Calculation (min): 16 min Charges:  OT General Charges $OT Visit: 1 Procedure OT Evaluation $Initial OT Evaluation Tier I: 1 Procedure OT Treatments $Self Care/Home Management :  8-22 mins G-Codes: OT G-codes **NOT FOR INPATIENT CLASS** Functional Assessment Tool Used: clinical judgment Functional Limitation: Self care Self Care Current Status ([id number](G8987): At least 40 percent but less than 60 percent impaired, limited or restricted Self Care Goal Status (U0454(G8988): At least 40 percent but less than 60 percent impaired, limited or restricted  [facility] Hospital[facility]ER,Olivia Cole 09/02/2014, 10:22 AM Marica OtterMaryellen [name], OTR/L [phone] 09/02/2014

## 2014-09-02 NOTE — Progress Notes (Signed)
RN reviewed discharge instructions with patient and family. All questions answered.   Paperwork and prescriptions given.   RN rolled patient down to family car.   

## 2014-09-02 NOTE — Progress Notes (Signed)
UR completed 

## 2014-09-02 NOTE — Progress Notes (Signed)
   Subjective: 1 Day Post-Op Procedure(s) (LRB): HEMI LAMINECTOMY MICRODISCECTOMY L5-S1 LEFT  (Left) Patient reports pain as mild.   Patient seen in rounds without Dr. Darrelyn HillockGioffre Patient is well, and has had no acute complaints or problems other than feeling lightheaded. She reports that she has been up and voided on her own. No issues overnight. Husband in room during rounds.  Plan is to go Home after hospital stay.  Objective: Vital signs in last 24 hours: Temp:  [97.4 F (36.3 C)-99 F (37.2 C)] 99 F (37.2 C) (11/06 0604) Pulse Rate:  [60-82] 77 (11/06 0604) Resp:  [14-18] 16 (11/06 0604) BP: (125-152)/(63-97) 133/63 mmHg (11/06 0604) SpO2:  [96 %-100 %] 96 % (11/06 0604) Weight:  [76.318 kg (168 lb 4 oz)] 76.318 kg (168 lb 4 oz) (11/05 1945)  Intake/Output from previous day:  Intake/Output Summary (Last 24 hours) at 09/02/14 0748 Last data filed at 09/02/14 0542  Gross per 24 hour  Intake   2820 ml  Output   1975 ml  Net    845 ml     EXAM General - Patient is Alert and Oriented Extremity - Neurologically intact Intact pulses distally Dorsiflexion/Plantar flexion intact Dressing/Incision - clean, dry, no drainage Motor Function - intact, moving foot and toes well on exam.   Past Medical History  Diagnosis Date  . Seasonal allergies     rare  . Ganglion cyst     right anterior hand-no problem  . Anemia     past hx. anemia  . Lumbar herniated disc     L5-S1    Assessment/Plan: 1 Day Post-Op Procedure(s) (LRB): HEMI LAMINECTOMY MICRODISCECTOMY L5-S1 LEFT  (Left) Active Problems:   Herniated lumbar intervertebral disc  Estimated body mass index is 26.35 kg/(m^2) as calculated from the following:   Height as of this encounter: 5\' 7"  (1.702 m).   Weight as of this encounter: 76.318 kg (168 lb 4 oz). Advance diet Up with therapy D/C IV fluids Discharge home  Weight-Bearing as tolerated   Discharge home this afternoon after therapy and completion of  antibiotics. Discharge instructions discussed with the patient and her husband.   Dimitri PedAmber Tionna Gigante, PA-C Orthopaedic Surgery 09/02/2014, 7:48 AM

## 2014-09-02 NOTE — Progress Notes (Signed)
   09/02/14 1312  OT Visit Information  Last OT Received On 09/02/14  Assistance Needed +1  History of Present Illness Pt is a 58 year old female s/p hemilaminectomy, microdiskectomy, decompression L5-S1  OT Time Calculation  OT Start Time 1239  OT Stop Time 1252  OT Time Calculation (min) 13 min  Precautions  Precautions Back  Pain Assessment  Pain Assessment 0-10  Pain Score 4  Pain Location back  Pain Descriptors / Indicators Sore  Pain Intervention(s) Limited activity within patient's tolerance;Monitored during session;Premedicated before session;Repositioned  Cognition  Arousal/Alertness Awake/alert  Behavior During Therapy WFL for tasks assessed/performed  Overall Cognitive Status Within Functional Limits for tasks assessed  ADL  Overall ADL's  Needs assistance/impaired  StatisticianToilet Transfer Min guard;Ambulation;Comfort height toilet;Grab bars  Toileting- Clothing Manipulation and Hygiene Supervision/safety;Sit to/from stand (grab bar)  Tub/ Solicitorhower Transfer Min guard;Walk-in shower;Ambulation  General ADL Comments provided min guard due to feeling lighheaded earlier.  Pt and husband verbalize all education.  She will benefit from 3:1 commode  Bed Mobility  Overal bed mobility Needs Assistance  Bed Mobility Rolling;Sidelying to Sit;Sit to Sidelying  Rolling Supervision  Sidelying to sit Supervision  Sit to sidelying Supervision  General bed mobility comments verbal cues for log roll technique  Transfers  Overall transfer level Needs assistance  Equipment used None  Transfers Sit to/from Stand  Sit to Stand Supervision  General transfer comment verbal cue for straight back  OT - End of Session  Activity Tolerance Patient tolerated treatment well  Patient left in bed;with call bell/phone within reach;with family/visitor present;with nursing/sitter in room  OT Assessment/Plan  Follow Up Recommendations No OT follow up  OT Equipment 3 in 1 bedside comode  OT Goal  Progression  Progress towards OT goals Progressing toward goals  OT G-codes **NOT FOR INPATIENT CLASS**  Self Care Discharge Status [phone](G8989) CK  OT General Charges  $OT Visit 1 Procedure  OT Treatments  $Self Care/Home Management  8-22 mins  Marica OtterMaryellen Oscar Hank, OTR/L [phone] 09/02/2014

## 2014-09-02 NOTE — Evaluation (Signed)
Physical Therapy One Time Evaluation Patient Details Name: Olivia Cole MRN: [id number] DOB: [birth year] Today's Date: 09/02/2014   History of Present Illness  Pt is a 58 year old female s/p hemilaminectomy, microdiskectomy, decompression L5-S1  Clinical Impression  Patient evaluated by Physical Therapy with no further acute PT needs identified. All education has been completed and the patient has no further questions. See below for any follow-up Physial Therapy or equipment needs. PT is signing off. Thank you for this referral.  Pt educated on back precautions.  Pt able to perform steps and ambulate without any difficulty and denied dizziness during session.  Encouraged pt to ambulate with spouse later only if not feeling dizziness for safety.     Follow Up Recommendations No PT follow up    Equipment Recommendations  None recommended by PT    Recommendations for Other Services       Precautions / Restrictions Precautions Precautions: Back Precaution Comments: pt and spouse educated on back precautions Restrictions Weight Bearing Restrictions: No      Mobility  Bed Mobility Overal bed mobility: Needs Assistance Bed Mobility: Rolling;Sidelying to Sit;Sit to Sidelying Rolling: Supervision Sidelying to sit: Supervision Supine to sit: Supervision   Sit to sidelying: Supervision General bed mobility comments: verbal cues for log roll technique  Transfers Overall transfer level: Needs assistance Equipment used: None Transfers: Sit to/from Stand Sit to Stand: Supervision         General transfer comment: verbal cue for straight back  Ambulation/Gait Ambulation/Gait assistance: Supervision Ambulation Distance (Feet): 240 Feet Assistive device: None Gait Pattern/deviations: Step-through pattern     General Gait Details: verbal cues for precautions with activity, stiff upper body during gait but no weakness or buckling  Stairs Stairs: Yes Stairs assistance:  Supervision Stair Management: Alternating pattern;Step to pattern;Forwards;One rail Right Number of Stairs: 4 General stair comments: verbal cues for safety, pt able to perform step through ascending but step to descending with cues for sequence (states L LE with presurgical symptoms however none present at this time)  Wheelchair Mobility    Modified Rankin (Stroke Patients Only)       Balance                                             Pertinent Vitals/Pain Pain Assessment: 0-10 Pain Score: 3  Pain Location: back Pain Descriptors / Indicators: Sore Pain Intervention(s): Limited activity within patient's tolerance;Monitored during session;Premedicated before session;Repositioned    Home Living Family/patient expects to be discharged to:: Private residence Living Arrangements: Spouse/significant other   Type of Home: House Home Access: Stairs to enter Entrance Stairs-Rails: Right   Home Layout: One level Home Equipment: None      Prior Function Level of Independence: Independent         Comments: works as professor at [employer]     Hand Dominance        Extremity/Trunk Assessment   Upper Extremity Assessment: Overall WFL for tasks assessed           Lower Extremity Assessment: Overall WFL for tasks assessed      Cervical / Trunk Assessment: Normal  Communication   Communication: No difficulties  Cognition Arousal/Alertness: Awake/alert Behavior During Therapy: WFL for tasks assessed/performed Overall Cognitive Status: Within Functional Limits for tasks assessed  General Comments      Exercises        Assessment/Plan    PT Assessment Patent does not need any further PT services  PT Diagnosis Difficulty walking;Acute pain   PT Problem List    PT Treatment Interventions     PT Goals (Current goals can be found in the Care Plan section) Acute Rehab PT Goals Patient Stated Goal: get back to work,  bicycling PT Goal Formulation: All assessment and education complete, DC therapy    Frequency     Barriers to discharge        Co-evaluation               End of Session   Activity Tolerance: Patient tolerated treatment well Patient left: in bed;with call bell/phone within reach;with family/visitor present      Functional Assessment Tool Used: clinical judgement Functional Limitation: Mobility: Walking and moving around Mobility: Walking and Moving Around Current Status ([id number](G8978): At least 1 percent but less than 20 percent impaired, limited or restricted Mobility: Walking and Moving Around Goal Status [phone](G8979): 0 percent impaired, limited or restricted Mobility: Walking and Moving Around Discharge Status [phone](G8980): 0 percent impaired, limited or restricted    Time: [id number] PT Time Calculation (min): 19 min   Charges:   PT Evaluation $Initial PT Evaluation Tier I: 1 Procedure PT Treatments $Gait Training: 8-22 mins   PT G Codes:   Functional Assessment Tool Used: clinical judgement Functional Limitation: Mobility: Walking and moving around    LookoutLEMYRE,KATHrine E 09/02/2014, 1:18 PM Zenovia JarredKati Azriel Dancy, PT, DPT 09/02/2014 Pager: [phone]

## 2014-09-13 NOTE — Discharge Summary (Signed)
Physician Discharge Summary   Patient ID: Olivia Cole MRN: [id number] DOB/AGE: 1956/02/09 58 y.o.  Admit date: 09/01/2014 Discharge date: 09/02/2014  Primary Diagnosis: Lumbar disc herniation   Admission Diagnoses:  Past Medical History  Diagnosis Date  . Seasonal allergies     rare  . Ganglion cyst     right anterior hand-no problem  . Anemia     past hx. anemia  . Lumbar herniated disc     L5-S1   Discharge Diagnoses:   Active Problems:   Herniated lumbar intervertebral disc  Estimated body mass index is 26.35 kg/(m^2) as calculated from the following:   Height as of this encounter: 5' 7"  (1.702 m).   Weight as of this encounter: 76.318 kg (168 lb 4 oz).  Procedure:  Procedure(s) (LRB): HEMI LAMINECTOMY MICRODISCECTOMY L5-S1 LEFT  (Left)   Consults: None  HPI: The patient presented with the chief complaint of low back pain. She has had this pain for about 2 months with no known injury. She developed pain and weakness in the left LE. MRI showed a lumbar disc herniation at L5-S1 on the left.   Laboratory Data: Hospital Outpatient Visit on 08/26/2014  Component Date Value Ref Range Status  . WBC 08/26/2014 4.8  4.0 - 10.5 K/uL Final  . RBC 08/26/2014 4.28  3.87 - 5.11 MIL/uL Final  . Hemoglobin 08/26/2014 12.9  12.0 - 15.0 g/dL Final  . HCT 08/26/2014 39.3  36.0 - 46.0 % Final  . MCV 08/26/2014 91.8  78.0 - 100.0 fL Final  . MCH 08/26/2014 30.1  26.0 - 34.0 pg Final  . MCHC 08/26/2014 32.8  30.0 - 36.0 g/dL Final  . RDW 08/26/2014 12.5  11.5 - 15.5 % Final  . Platelets 08/26/2014 217  150 - 400 K/uL Final  . MRSA, PCR 08/26/2014 NEGATIVE  NEGATIVE Final  . Staphylococcus aureus 08/26/2014 POSITIVE* NEGATIVE Final   Comment:                                 The Xpert SA Assay (FDA                          approved for NASAL specimens                          in patients over 43 years of age),                          is one component of     a comprehensive surveillance                          program.  Test performance has                          been validated by [employer] for patients greater                          than or equal to 27 year old.  It is not intended                          to diagnose infection nor to                          guide or monitor treatment.  Marland Kitchen aPTT 08/26/2014 33  24 - 37 seconds Final  . Sodium 08/26/2014 142  137 - 147 mEq/L Final  . Potassium 08/26/2014 4.2  3.7 - 5.3 mEq/L Final  . Chloride 08/26/2014 102  96 - 112 mEq/L Final  . CO2 08/26/2014 29  19 - 32 mEq/L Final  . Glucose, Bld 08/26/2014 103* 70 - 99 mg/dL Final  . BUN 08/26/2014 22  6 - 23 mg/dL Final  . Creatinine, Ser 08/26/2014 0.91  0.50 - 1.10 mg/dL Final  . Calcium 08/26/2014 9.5  8.4 - 10.5 mg/dL Final  . Total Protein 08/26/2014 7.0  6.0 - 8.3 g/dL Final  . Albumin 08/26/2014 3.9  3.5 - 5.2 g/dL Final  . AST 08/26/2014 23  0 - 37 U/L Final  . ALT 08/26/2014 25  0 - 35 U/L Final  . Alkaline Phosphatase 08/26/2014 74  39 - 117 U/L Final  . Total Bilirubin 08/26/2014 0.4  0.3 - 1.2 mg/dL Final  . GFR calc non Af Amer 08/26/2014 69* >90 mL/min Final  . GFR calc Af Amer 08/26/2014 80* >90 mL/min Final   Comment: (NOTE)                          The eGFR has been calculated using the CKD EPI equation.                          This calculation has not been validated in all clinical situations.                          eGFR's persistently <90 mL/min signify possible Chronic Kidney                          Disease.  . Anion gap 08/26/2014 11  5 - 15 Final  . Prothrombin Time 08/26/2014 13.3  11.6 - 15.2 seconds Final  . INR 08/26/2014 1.00  0.00 - 1.49 Final  . Color, Urine 08/26/2014 YELLOW  YELLOW Final  . APPearance 08/26/2014 CLEAR  CLEAR Final  . Specific Gravity, Urine 08/26/2014 1.027  1.005 - 1.030 Final  . pH 08/26/2014 5.5  5.0 - 8.0 Final  . Glucose, UA  08/26/2014 NEGATIVE  NEGATIVE mg/dL Final  . Hgb urine dipstick 08/26/2014 TRACE* NEGATIVE Final  . Bilirubin Urine 08/26/2014 NEGATIVE  NEGATIVE Final  . Ketones, ur 08/26/2014 NEGATIVE  NEGATIVE mg/dL Final  . Protein, ur 08/26/2014 NEGATIVE  NEGATIVE mg/dL Final  . Urobilinogen, UA 08/26/2014 0.2  0.0 - 1.0 mg/dL Final  . Nitrite 08/26/2014 NEGATIVE  NEGATIVE Final  . Leukocytes, UA 08/26/2014 NEGATIVE  NEGATIVE Final  . WBC, UA 08/26/2014 3-6  <3 WBC/hpf Final  . RBC / HPF 08/26/2014 3-6  <3 RBC/hpf Final  . Bacteria, UA 08/26/2014 MANY* RARE Final  . Urine-Other 08/26/2014 MUCOUS PRESENT   Final     X-Rays:Dg Lumbar Spine 2-3 Views  08/26/2014   CLINICAL DATA:  Preoperative L5-S1 hemilaminectomy  EXAM: LUMBAR SPINE -  2-3 VIEW  COMPARISON:  None.  FINDINGS: Frontal and lateral views were obtained. There are 5 non-rib-bearing lumbar type vertebral bodies. There is minimal thoracolumbar dextroscoliosis. No fracture or spondylolisthesis. Disc spaces appear intact. No erosive change.  IMPRESSION: Slight scoliosis. No appreciable disc space narrowing. No fracture or spondylolisthesis.   Electronically Signed   By: Lowella Grip M.D.   On: 08/26/2014 10:30   Dg Spine Portable 1 View  09/02/2014   CLINICAL DATA:  Lumbar laminectomy  EXAM: PORTABLE SPINE - 1 VIEW  COMPARISON:  Films from earlier in the same day  FINDINGS: Surgical instrument is now noted in the spinal canal just posterior to the L5 vertebra. The numbering nomenclature is similar to that used on prior exams.  IMPRESSION: Intraoperative lumbar localization.   Electronically Signed   By: Inez Catalina M.D.   On: 09/02/2014 07:46   Dg Spine Portable 1 View  09/02/2014   CLINICAL DATA:  Lumbar laminectomy  EXAM: PORTABLE SPINE - 1 VIEW  COMPARISON:  Film from earlier in the same day  FINDINGS: Lateral radiograph of the lumbar spine was obtained and reveal surgical instruments posterior to the L5-S1 interspace. Numbering nomenclature  similar to that used on the recent film as well as a prior examination from 08/26/2014  IMPRESSION: Intraoperative lumbar localization for laminectomy.   Electronically Signed   By: Inez Catalina M.D.   On: 09/02/2014 07:44   Dg Spine Portable 1 View  09/01/2014   CLINICAL DATA:  Please number lumbar vertebra.  EXAM: PORTABLE SPINE - 1 VIEW  COMPARISON:  08/26/2014  FINDINGS: Using the same numbering scheme as the comparison study the image was annotated. Two surgical probes are identified posterior to S1 and S2.  IMPRESSION: 1. Lumbar spine levels annotated using similar numbering scheme as 08/26/2014. 2. Surgical probes posterior to the sacrum.   Electronically Signed   By: Kerby Moors M.D.   On: 09/01/2014 18:55    EKG: Orders placed or performed during the hospital encounter of 08/26/14  . EKG  . EKG     Hospital Course: Olivia Cole is a 58 y.o. who was admitted to [facility] They were brought to the operating room on 09/01/2014 and underwent Procedure(s): HEMI LAMINECTOMY MICRODISCECTOMY L5-S1 LEFT .  Patient tolerated the procedure well and was later transferred to the recovery room and then to the orthopaedic floor for postoperative care.  They were given PO and IV analgesics for pain control following their surgery.  They were given 24 hours of postoperative antibiotics of  Anti-infectives    Start     Dose/Rate Route Frequency Ordered Stop   09/02/14 0600  ceFAZolin (ANCEF) IVPB 2 g/50 mL premix     2 g100 mL/hr over 30 Minutes Intravenous On call to O.R. 09/01/14 1309 09/01/14 1611   09/01/14 2200  ceFAZolin (ANCEF) IVPB 1 g/50 mL premix     1 g100 mL/hr over 30 Minutes Intravenous 3 times per day 09/01/14 2000 09/02/14 1356   09/01/14 1639  polymyxin B 500,000 Units, bacitracin 50,000 Units in sodium chloride irrigation 0.9 % 500 mL irrigation  Status:  Discontinued       As needed 09/01/14 1639 09/01/14 1753    PT was ordered to walk with the patient.  Discharge  planning consulted to help with postop disposition and equipment needs.  Patient had a decent night on the evening of surgery. She initially had some issues with dizziness that resolved. They started to get up  OOB with therapy on day one.  Patient was seen in rounds and was ready to go home.   Diet: Regular diet Activity:WBAT Follow-up:in 2 weeks Disposition - Home Discharged Condition: stable   Discharge Instructions    Call MD / Call 911    Complete by:  As directed   If you experience chest pain or shortness of breath, CALL 911 and be transported to the hospital emergency room.  If you develope a fever above 101 F, pus (white drainage) or increased drainage or redness at the wound, or calf pain, call your surgeon's office.     Constipation Prevention    Complete by:  As directed   Drink plenty of fluids.  Prune juice may be helpful.  You may use a stool softener, such as Colace (over the counter) 100 mg twice a day.  Use MiraLax (over the counter) for constipation as needed.     Diet general    Complete by:  As directed      Discharge instructions    Complete by:  As directed   Change your dressing daily. Shower only, no tub bath. Call if any temperatures greater than 101 or any wound complications: [phone] during the day and ask for Dr. Charlestine Night nurse, Brunilda Payor.     Driving restrictions    Complete by:  As directed   No driving for 2 weeks     Increase activity slowly as tolerated    Complete by:  As directed      Lifting restrictions    Complete by:  As directed   No lifting            Medication List    STOP taking these medications        HYDROcodone-acetaminophen 5-325 MG per tablet  Commonly known as:  NORCO/VICODIN      TAKE these medications        calcium carbonate 750 MG chewable tablet  Commonly known as:  TUMS EX  Chew 1 tablet by mouth every morning.     GLUCOSAMINE HCL PO  Take 1 tablet by mouth every morning.     ibuprofen 200 MG tablet    Commonly known as:  ADVIL,MOTRIN  Take 400-600 mg by mouth every 4 (four) hours as needed for headache or mild pain.     methocarbamol 500 MG tablet  Commonly known as:  ROBAXIN  Take 1 tablet (500 mg total) by mouth every 6 (six) hours as needed for muscle spasms.     multivitamin tablet  Take 1 tablet by mouth every morning.     mupirocin ointment 2 %  Commonly known as:  BACTROBAN  Place 1 application into the nose 2 (two) times daily.     oxyCODONE-acetaminophen 5-325 MG per tablet  Commonly known as:  PERCOCET/ROXICET  Take 1-2 tablets by mouth every 4 (four) hours as needed for moderate pain.     VITAMIN D PO  Take 1 tablet by mouth every morning.           Follow-up Information    Follow up with GIOFFRE,RONALD A, MD. Schedule an appointment as soon as possible for a visit in 2 weeks.   Specialty:  Orthopedic Surgery   Contact information:   [address] [place] [address] [phone]       Signed: Ardeen Jourdain, PA-C Orthopaedic Surgery 09/13/2014, 8:29 AM

## 2015-05-16 ENCOUNTER — Ambulatory Visit: Payer: BC Managed Care – PPO | Admitting: Neurology

## 2015-06-01 ENCOUNTER — Ambulatory Visit (INDEPENDENT_AMBULATORY_CARE_PROVIDER_SITE_OTHER): Payer: BC Managed Care – PPO | Admitting: Neurology

## 2015-06-01 ENCOUNTER — Encounter: Payer: Self-pay | Admitting: Neurology

## 2015-06-01 VITALS — BP 123/84 | HR 73 | Ht 67.0 in | Wt 170.0 lb

## 2015-06-01 DIAGNOSIS — G518 Other disorders of facial nerve: Secondary | ICD-10-CM | POA: Diagnosis not present

## 2015-06-01 DIAGNOSIS — G5139 Clonic hemifacial spasm, unspecified: Secondary | ICD-10-CM

## 2015-06-01 NOTE — Progress Notes (Signed)
PATIENT: Olivia Cole DOB: Jul 05, 1956  Chief Complaint  Patient presents with  . Blepharospasm    She has been experiencing intermittent left eye twitching for years.  Dr. Sandria Manly had recommended Botox in the past but she never proceeded with treatment.     HISTORICAL  Olivia Cole is a 59 years old right-handed female, seen in refer by her primary care physician Dr. Christiane Ha for evaluation of left eye twitching  I have reviewed and summarized her most recent office visit in July 2016 She has past medical history of abnormal glucose, lumbar the compression surgery in November 2015 for low back pain. Most recent laboratory evaluation in May 03 2015, A1c is 5.7, normal CMP, normal TSH, hemoglobin 13 point 5, mild elevated cholesterol 232  She began to notice intermittent left eye teaching since age 77, initially it was only intermittent, involving her left lower eyelid, over the years, it has gradually getting worse, now is present 60-70% of her daytime, sometimes forceful left eye muscle twitching to the point of closing her eyes, difficulty driving, she could not read for prolonged period of time, she also noticed spreading of the muscle twitching to involve her left cheek muscles, drawing of her left mouth corner.  She was seen by Dr. love in 2003 for similar complaints, was diagnosed with left hemifacial spasm, I have reviewed MRI of the brain with and without contrast report in December 2003 that was normal, in specific, bilateral seventh, eighth nerves were normal    ALLERGIES: No Known Allergies  HOME MEDICATIONS: Current Outpatient Prescriptions  Medication Sig Dispense Refill  . calcium carbonate (TUMS EX) 750 MG chewable tablet Chew 1 tablet by mouth every morning.     . Cholecalciferol (VITAMIN D PO) Take 1 tablet by mouth every morning.     Marland Kitchen GLUCOSAMINE HCL PO Take 1 tablet by mouth every morning.     Marland Kitchen ibuprofen (ADVIL,MOTRIN) 200 MG tablet Take 400-600 mg by  mouth every 4 (four) hours as needed for headache or mild pain.    . methocarbamol (ROBAXIN) 500 MG tablet Take 1 tablet (500 mg total) by mouth every 6 (six) hours as needed for muscle spasms. 40 tablet 1  . Multiple Vitamin (MULTIVITAMIN) tablet Take 1 tablet by mouth every morning.     Marland Kitchen oxyCODONE-acetaminophen (PERCOCET/ROXICET) 5-325 MG per tablet Take 1-2 tablets by mouth every 4 (four) hours as needed for moderate pain. 60 tablet 0     PAST MEDICAL HISTORY: Past Medical History  Diagnosis Date  . Seasonal allergies     rare  . Ganglion cyst     right anterior hand-no problem  . Anemia     past hx. anemia  . Lumbar herniated disc     L5-S1    PAST SURGICAL HISTORY: Past Surgical History  Procedure Laterality Date  . Tonsillectomy  1982  . Cesarean section  1988  . Anterior cruciate ligament repair Left 1998  . Lasik    . Lumbar laminectomy/decompression microdiscectomy Left 09/01/2014    Procedure: HEMI LAMINECTOMY MICRODISCECTOMY L5-S1 LEFT ;  Surgeon: Jacki Cones, MD;  Location: [facility];  Service: Orthopedics;  Laterality: Left;    FAMILY HISTORY: Family History  Problem Relation Age of Onset  . Colon cancer Neg Hx     SOCIAL HISTORY:  History   Social History  . Marital Status: Married    Spouse Name: N/A  . Number of Children: 3  . Years of Education: N/A  Occupational History  . Professor, communication studies   Social History Main Topics  . Smoking status: Never Smoker   . Smokeless tobacco: Never Used  . Alcohol Use: 4.2 oz/week    7 Glasses of wine per week     Comment: wine-daily  . Drug Use: No  . Sexual Activity: Yes   Other Topics Concern  . Not on file   Social History Narrative     PHYSICAL EXAM   Filed Vitals:   06/01/15 0800  BP: 123/84  Pulse: 73  Height: 5\' 7"  (1.702 m)  Weight: 170 lb (77.111 kg)    Body mass index is 26.62 kg/(m^2).  PHYSICAL EXAMNIATION:  Gen: NAD, conversant, well nourised, obese, well  groomed                     Cardiovascular: Regular rate rhythm, no peripheral edema, warm, nontender. Eyes: Conjunctivae clear without exudates or hemorrhage Neck: Supple, no carotid bruise. Pulmonary: Clear to auscultation bilaterally   NEUROLOGICAL EXAM:  MENTAL STATUS: Speech:    Speech is normal; fluent and spontaneous with normal comprehension.  Cognition:     Orientation to time, place and person     Normal recent and remote memory     Normal Attention span and concentration     Normal Language, naming, repeating,spontaneous speech     Fund of knowledge   CRANIAL NERVES: CN II: Visual fields are full to confrontation. Fundoscopic exam is normal with sharp discs and no vascular changes. Pupils are round equal and briskly reactive to light. CN III, IV, VI: extraocular movement are normal. No ptosis. CN V: Facial sensation is intact to pinprick in all 3 divisions bilaterally. Corneal responses are intact.  CN VII: Face is symmetric with normal eye closure and smile. She has frequent left facial muscle twitching, starting from left lower orbicularis oculi, spreading to involve upper part of left orbicularis oculi, left corrugate, mild left cheek muscles, left frontal head is muscles  CN VIII: Hearing is normal to rubbing fingers CN IX, X: Palate elevates symmetrically. Phonation is normal. CN XI: Head turning and shoulder shrug are intact CN XII: Tongue is midline with normal movements and no atrophy.  MOTOR: There is no pronator drift of out-stretched arms. Muscle bulk and tone are normal. Muscle strength is normal.  REFLEXES: Reflexes are 2+ and symmetric at the biceps, triceps, knees, and ankles. Plantar responses are flexor.  SENSORY: Intact to light touch, pinprick, position sense, and vibration sense are intact in fingers and toes.  COORDINATION: Rapid alternating movements and fine finger movements are intact. There is no dysmetria on finger-to-nose and heel-knee-shin.     GAIT/STANCE: Posture is normal. Gait is steady with normal steps, base, arm swing, and turning. Heel and toe walking are normal. Tandem gait is normal.  Romberg is absent.   DIAGNOSTIC DATA (LABS, IMAGING, TESTING) - I reviewed patient records, labs, notes, testing and imaging myself where available.   ASSESSMENT AND PLAN  Loyd Salvador is a 59 y.o. female   Left hemifacial spasm  Previously normal MRI of brain  We have discussed treatment options, she would benefit EMG guided xeomin, preauthorization for 50 units of xeomin  Return to clinic in one month   Levert Feinstein, M.D. Ph.D.  [facility] [address] [place], [place] [address] Ph: [phone] Fax: [phone]  CC: To Christiane Ha

## 2015-07-07 ENCOUNTER — Telehealth: Payer: Self-pay | Admitting: Neurology

## 2015-07-07 NOTE — Telephone Encounter (Signed)
Patient is calling to follow up on an appointment that was to be scheduled for Xeomin. Thank you.

## 2015-07-12 NOTE — Telephone Encounter (Signed)
Returned patients call, she did not answer so I left a VM asking her to return my call. Prior auths are good to go for [facility] injection.  CALL CENTER: If she calls back could you please schedule her with Dr. Terrace Arabia in October for [facility]. There was an apt available on OCT 4th. @ 12. Thank you!!

## 2015-07-31 ENCOUNTER — Telehealth: Payer: Self-pay

## 2015-07-31 ENCOUNTER — Telehealth: Payer: Self-pay | Admitting: Neurology

## 2015-07-31 NOTE — Telephone Encounter (Signed)
Called and spoke to patient about her Xeomin I had CX her apt but she is buy and bill . Called patient back and explained to her . Patient is rescheduled for 08/02/2015 with Dr. Terrace Arabia at 7:45.  Xeomin injection 50 units .

## 2015-07-31 NOTE — Telephone Encounter (Signed)
Patient is calling as she is confused about the cancellation of her Xeomin shot on 08/03/15 and states she desperately needs to get this done as this is her first shot and she is not feeling well. Please call  and advise.

## 2015-07-31 NOTE — Telephone Encounter (Signed)
Apt. Will be Cx. For Xeomin Patient is not due for injection until Nov.

## 2015-08-02 ENCOUNTER — Encounter: Payer: Self-pay | Admitting: Neurology

## 2015-08-02 ENCOUNTER — Encounter: Payer: Self-pay | Admitting: *Deleted

## 2015-08-02 ENCOUNTER — Ambulatory Visit (INDEPENDENT_AMBULATORY_CARE_PROVIDER_SITE_OTHER): Payer: BC Managed Care – PPO | Admitting: Neurology

## 2015-08-02 VITALS — BP 123/80 | HR 66 | Ht 67.0 in | Wt 167.0 lb

## 2015-08-02 DIAGNOSIS — G513 Clonic hemifacial spasm: Secondary | ICD-10-CM

## 2015-08-02 DIAGNOSIS — G5139 Clonic hemifacial spasm, unspecified: Secondary | ICD-10-CM | POA: Insufficient documentation

## 2015-08-02 MED ORDER — INCOBOTULINUMTOXINA 50 UNITS IM SOLR
50.0000 [IU] | Freq: Once | INTRAMUSCULAR | Status: AC
  Administered 2015-08-02: 50 [IU] via INTRAMUSCULAR

## 2015-08-02 NOTE — Progress Notes (Signed)
**  Xeomin 50 units, Lot [id number], Exp 03/2017, NDC 0259-1605-01, Office Supply**mck,rn 

## 2015-08-02 NOTE — Progress Notes (Signed)
PATIENT: Olivia Cole DOB: 22-Dec-1955  Chief Complaint  Patient presents with  . Facial Spasms    Xeomin 50 units - [employer]     HISTORICAL  Olivia Cole is a 59 years old right-handed female, seen in refer by her primary care physician Dr. Christiane Ha for evaluation of left eye twitching  I have reviewed and summarized her most recent office visit in July 2016 She has past medical history of abnormal glucose, lumbar the compression surgery in November 2015 for low back pain. Most recent laboratory evaluation in May 03 2015, A1c is 5.7, normal CMP, normal TSH, hemoglobin 13 point 5, mild elevated cholesterol 232  She began to notice intermittent left eye teaching since age 60, initially it was only intermittent, involving her left lower eyelid, over the years, it has gradually getting worse, now is present 60-70% of her daytime, sometimes forceful left eye muscle twitching to the point of closing her eyes, difficulty driving, she could not read for prolonged period of time, she also noticed spreading of the muscle twitching to involve her left cheek muscles, drawing of her left mouth corner.  She was seen by Dr. love in 2003 for similar complaints, was diagnosed with left hemifacial spasm, I have reviewed MRI of the brain with and without contrast report in December 2003 that was normal, in specific, bilateral seventh, eighth nerves were normal    UPDATE Aug 02 2015:  She came in for her first EMG guided xeomin injection for left hemifacial spasm, consent form was signed. Questions was answered,    ALLERGIES: No Known Allergies  HOME MEDICATIONS: Current Outpatient Prescriptions  Medication Sig Dispense Refill  . calcium carbonate (TUMS EX) 750 MG chewable tablet Chew 1 tablet by mouth every morning.     . Cholecalciferol (VITAMIN D PO) Take 1 tablet by mouth every morning.     Marland Kitchen GLUCOSAMINE HCL PO Take 1 tablet by mouth every morning.     Marland Kitchen ibuprofen  (ADVIL,MOTRIN) 200 MG tablet Take 400-600 mg by mouth every 4 (four) hours as needed for headache or mild pain.    . methocarbamol (ROBAXIN) 500 MG tablet Take 1 tablet (500 mg total) by mouth every 6 (six) hours as needed for muscle spasms. 40 tablet 1  . Multiple Vitamin (MULTIVITAMIN) tablet Take 1 tablet by mouth every morning.     Marland Kitchen oxyCODONE-acetaminophen (PERCOCET/ROXICET) 5-325 MG per tablet Take 1-2 tablets by mouth every 4 (four) hours as needed for moderate pain. 60 tablet 0     PAST MEDICAL HISTORY: Past Medical History  Diagnosis Date  . Seasonal allergies     rare  . Ganglion cyst     right anterior hand-no problem  . Anemia     past hx. anemia  . Lumbar herniated disc     L5-S1  . Blepharitis of eyelid of left eye     PAST SURGICAL HISTORY: Past Surgical History  Procedure Laterality Date  . Tonsillectomy  1982  . Cesarean section  1988  . Anterior cruciate ligament repair Left 1998  . Lasik    . Lumbar laminectomy/decompression microdiscectomy Left 09/01/2014    Procedure: HEMI LAMINECTOMY MICRODISCECTOMY L5-S1 LEFT ;  Surgeon: Jacki Cones, MD;  Location: [facility];  Service: Orthopedics;  Laterality: Left;    FAMILY HISTORY: Family History  Problem Relation Age of Onset  . Prostate cancer Father   . Hypertension Father   . Diabetes Father   . Hearing loss Mother   .  Dementia Father     SOCIAL HISTORY:  History   Social History  . Marital Status: Married    Spouse Name: N/A  . Number of Children: 3  . Years of Education: N/A   Occupational History  . Professor, communication studies   Social History Main Topics  . Smoking status: Never Smoker   . Smokeless tobacco: Never Used  . Alcohol Use: 4.2 oz/week    7 Glasses of wine per week     Comment: wine-daily  . Drug Use: No  . Sexual Activity: Yes   Other Topics Concern  . Not on file   Social History Narrative     PHYSICAL EXAM   Filed Vitals:   08/02/15 0759  BP: 123/80    Pulse: 66  Height:  (1.702 m)  Weight: 167 lb (75.751 kg)    Body mass index is 26.15 kg/(m^2).  PHYSICAL EXAMNIATION:  Gen: NAD, conversant, well nourised, obese, well groomed                     Cardiovascular: Regular rate rhythm, no peripheral edema, warm, nontender. Eyes: Conjunctivae clear without exudates or hemorrhage Neck: Supple, no carotid bruise. Pulmonary: Clear to auscultation bilaterally   NEUROLOGICAL EXAM:  MENTAL STATUS: Speech:    Speech is normal; fluent and spontaneous with normal comprehension.  Cognition:     Orientation to time, place and person     Normal recent and remote memory     Normal Attention span and concentration     Normal Language, naming, repeating,spontaneous speech     Fund of knowledge   CRANIAL NERVES: CN II: Visual fields are full to confrontation. Fundoscopic exam is normal with sharp discs and no vascular changes. Pupils are round equal and briskly reactive to light. CN III, IV, VI: extraocular movement are normal. No ptosis. CN V: Facial sensation is intact to pinprick in all 3 divisions bilaterally. Corneal responses are intact.  CN VII: Face is symmetric with normal eye closure and smile. She has frequent left facial muscle twitching, starting from left lower orbicularis oculi, spreading to involve upper part of left orbicularis oculi, left corrugate, mild left cheek muscles, left frontal head is muscles  CN VIII: Hearing is normal to rubbing fingers CN IX, X: Palate elevates symmetrically. Phonation is normal. CN XI: Head turning and shoulder shrug are intact CN XII: Tongue is midline with normal movements and no atrophy.  MOTOR: There is no pronator drift of out-stretched arms. Muscle bulk and tone are normal. Muscle strength is normal.  REFLEXES: Reflexes are 2+ and symmetric at the biceps, triceps, knees, and ankles. Plantar responses are flexor.  SENSORY: Intact to light touch, pinprick, position sense, and  vibration sense are intact in fingers and toes.  COORDINATION: Rapid alternating movements and fine finger movements are intact. There is no dysmetria on finger-to-nose and heel-knee-shin.    GAIT/STANCE: Posture is normal. Gait is steady with normal steps, base, arm swing, and turning. Heel and toe walking are normal. Tandem gait is normal.  Romberg is absent.   DIAGNOSTIC DATA (LABS, IMAGING, TESTING) - I reviewed patient records, labs, notes, testing and imaging myself where available.   ASSESSMENT AND PLAN  Olivia Cole is a 59 y.o. female   Left hemifacial spasm   EMG guided xeomin injection. 50 units/1cc of normal saline  Left orbicularis oculi at 2,3,4,6,8,10 clock (2.5 units each)x6=15 units Left zygomatic major 2.5 units Left corrugate 5 units Right corrugate 2.5  units Procerus 5 units Left frontalis 2.5 units times 2= 5 units Right frontalis 2.5 units  Left labial depressor 2.5 units  Left platysmas 5 units times 2= 10 units  Patient complains of lightheaded fainting sensation after injection, improved after lying down, BP 126/84, HR 62,   Return to clinic in 3 months for repeat injection   Levert Feinstein, M.D. Ph.D.  [facility] [address] [place], [place] [address] Ph: [phone] Fax: [phone]  CC: To Christiane Ha

## 2015-08-03 ENCOUNTER — Ambulatory Visit: Payer: BC Managed Care – PPO | Admitting: Neurology

## 2015-08-21 ENCOUNTER — Encounter: Payer: Self-pay | Admitting: Neurology

## 2015-08-21 ENCOUNTER — Ambulatory Visit (INDEPENDENT_AMBULATORY_CARE_PROVIDER_SITE_OTHER): Payer: BC Managed Care – PPO | Admitting: Neurology

## 2015-08-21 VITALS — BP 128/88 | HR 67 | Ht 67.0 in | Wt 167.0 lb

## 2015-08-21 DIAGNOSIS — G5139 Clonic hemifacial spasm, unspecified: Secondary | ICD-10-CM

## 2015-08-21 DIAGNOSIS — G518 Other disorders of facial nerve: Secondary | ICD-10-CM

## 2015-08-21 NOTE — Progress Notes (Signed)
Olivia Cole was seen today in neurologic consultation at the request of Emeterio ReeveWOLTERS,SHARON A, MD.  The patient is seen today in neurologic consultation for hemifacial spasm.  I have reviewed records that are available to me.  She is apparently had this since the age of 59 years old and actually sought consultation with Dr. Sandria ManlyLove in 2003.  At that time, she opted not to do anything in terms of treatment.  She began to see Dr. Terrace ArabiaYan because the symptoms were becoming more bothersome, so much so that it would cause near functional blindness.  The patient was ready to start treatment with Botox in the form of Xeomin.  She had her first and only series of injections on 08/02/2015.  50 units were injected.  The muscles included orbicularis oculi, frontalis, corrugator and procerus.  The patient complained of near syncope after the injections, which according to notes improved after she laid down.  Pt states that it was much more painful than she thought but also states that she was in bed for several days after.  Also admits that she was congested and was on mucinex and felt bad and perhaps that is why she felt bad.  States that she ended up on antibiotic for congestion and thinks that maybe this is why she was in bed.  She states that xeomin helped by 60%.  She is still having some twitch.  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  Very rarely she will have a pulling on the right cheeck.    The patient did have an MRI of the brain in 2003 that was done with and without gadolinium.  I do not have films.  I did review the report and it was reported to be normal.   ALLERGIES:  No Known Allergies  CURRENT MEDICATIONS:  Outpatient Encounter Prescriptions as of 08/21/2015  Medication Sig  . incobotulinumtoxinA (XEOMIN) 50 UNITS SOLR injection Inject 50 Units into the muscle once.  . Multiple Vitamin (MULTIVITAMIN) tablet Take 1 tablet by mouth daily.   No facility-administered  encounter medications on file as of 08/21/2015.    PAST MEDICAL HISTORY:   Past Medical History  Diagnosis Date  . Seasonal allergies     rare  . Ganglion cyst     right anterior hand-no problem  . Anemia     past hx. anemia  . Lumbar herniated disc     L5-S1  . Blepharitis of eyelid of left eye     PAST SURGICAL HISTORY:   Past Surgical History  Procedure Laterality Date  . Tonsillectomy  1982  . Cesarean section  1988  . Anterior cruciate ligament repair Left 1998  . Lasik    . Lumbar laminectomy/decompression microdiscectomy Left 09/01/2014    Procedure: HEMI LAMINECTOMY MICRODISCECTOMY L5-S1 LEFT ;  Surgeon: Jacki Conesonald A Gioffre, MD;  Location: [facility];  Service: Orthopedics;  Laterality: Left;    SOCIAL HISTORY:   Social History   Social History  . Marital Status: Married    Spouse Name: N/A  . Number of Children: 3  . 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 Smoking status: Never Smoker   . Smokeless tobacco: Never Used  . Alcohol Use: 4.2 oz/week    7 Glasses of wine per week     Comment: One glass of wine daily.  . Drug Use: No  . Sexual Activity:  Yes   Other Topics Concern  . Not on file   Social History Narrative   Lives at home with her husband.   Right-handed.   2 cups caffeine per day.    FAMILY HISTORY:   Family Status  Relation Status Death Age  . Mother Alive     healthy  . Father Deceased 76    Prostate cancer, dementia  . Brother Alive     healthy  . Son Alive     healthy  . Son Alive     healthy  . Daughter Alive     healthy    ROS:  A complete 10 system review of systems was obtained and was unremarkable apart from what is mentioned above.  PHYSICAL EXAMINATION:    VITALS:   Filed Vitals:   08/21/15 0921  BP: 128/88  Pulse: 67  Height:  (1.702 m)  Weight: 167 lb (75.751 kg)    GEN:  Normal appears female in no acute distress.  Appears stated  age. HEENT:  Normocephalic, atraumatic. The mucous membranes are moist. The superficial temporal arteries are without ropiness or tenderness. Cardiovascular: Regular rate and rhythm. Lungs: Clear to auscultation bilaterally. Neck/Heme: There are no carotid bruits noted bilaterally.  NEUROLOGICAL: Orientation:  The patient is alert and oriented x 3.  Fund of knowledge is appropriate.  Recent and remote memory intact.  Attention span and concentration normal.  Repeats and names without difficulty. Cranial nerves: There is good facial symmetry with the exception of the fact that the L palpebral fissure is wider than the right (appears since botox as looked at pics from her phone pre-botox). The pupils are equal round and reactive to light bilaterally. Fundoscopic exam reveals clear disc margins bilaterally. Extraocular muscles are intact and visual fields are full to confrontational testing. Speech is fluent and clear. Soft palate rises symmetrically and there is no tongue deviation. Hearing is intact to conversational tone. Tone: Tone is good throughout. Sensation: Sensation is intact to light touch and pinprick throughout (facial, trunk, extremities). Vibration is intact at the bilateral big toe. There is no extinction with double simultaneous stimulation. There is no sensory dermatomal level identified. Coordination:  The patient has no difficulty with RAM's or FNF bilaterally. Motor: Strength is 5/5 in the bilateral upper and lower extremities.  Shoulder shrug is equal and symmetric. There is no pronator drift.  There are no fasciculations noted. DTR's: Deep tendon reflexes are 2/4 at the bilateral biceps, triceps, brachioradialis, 2- at the bilateral patella and achilles.  Plantar responses are downgoing bilaterally. Gait and Station: The patient is able to ambulate without difficulty. The patient is able to heel toe walk without any difficulty. The patient is able to ambulate in a tandem fashion.  The patient is able to stand in the Romberg position. Abnormal movements:  Minimal (very minimal) facial spasm is noted on the L   IMPRESSION/PLAN  1. Left hemifacial spasm   -The patient has had a very long history of this, for almost 40 years.  She had her first series of Botox (Xeomin) injections with Dr. Terrace Arabia on 08/02/2015.  It sounds like the patient had a vagal reaction after the injections.  Talked to her extensively.  Risks, benefits, side effects of Botox including the black box warning.  Talked to her about differences in injection techniques between myself and Dr. Terrace Arabia.  I talked to her about risk of facial asymmetry both with Botox as well as with leaving chronic hemifacial spasm  untreated.  I told her I really would like to wait until this Botox wears off completely before we reinject.  We will anticipate reinjecting in February and her last injection was October 5.  She was agreeable.  Much greater than 50% of this 60 minute visit was in counseling.

## 2015-08-31 NOTE — Telephone Encounter (Signed)
Error

## 2015-11-08 ENCOUNTER — Ambulatory Visit: Payer: BC Managed Care – PPO | Admitting: Neurology

## 2015-12-01 ENCOUNTER — Ambulatory Visit (INDEPENDENT_AMBULATORY_CARE_PROVIDER_SITE_OTHER): Payer: BC Managed Care – PPO | Admitting: Neurology

## 2015-12-01 VITALS — Temp 98.0°F

## 2015-12-01 DIAGNOSIS — G5139 Clonic hemifacial spasm, unspecified: Secondary | ICD-10-CM

## 2015-12-01 DIAGNOSIS — G518 Other disorders of facial nerve: Secondary | ICD-10-CM

## 2015-12-01 MED ORDER — ONABOTULINUMTOXINA 100 UNITS IJ SOLR
12.5000 [IU] | Freq: Once | INTRAMUSCULAR | Status: AC
  Administered 2015-12-01: 12.5 [IU] via INTRAMUSCULAR

## 2015-12-01 NOTE — Procedures (Signed)
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  Risk discussed included, but were not limited pain and discomfort, bleeding, bruising, excessive weakness, venous thrombosis, muscle atrophy and dysphagia.  A copy of the patient medication guide was given to the patient which explains the blackbox warning.  Patients identity and treatment sites confirmed Yes.  .  Details of Procedure: Skin was cleaned with alcohol.   Prior to injection, the needle plunger was aspirated to make sure the needle was not within a blood vessel.  There was no blood retrieved on aspiration.    Following is a summary of the muscles injected  And the amount of Botulinum toxin used:  Injections  Location Left  Right Units Number of sites        Corrugator      Frontalis      Lower Lid, Lateral 2.5  2.5 1  Lower Lid Medial 2.5  2.5 1  Upper Lid, Lateral 2.5  2.5 1  Upper Lid, Medial 2.5  2.5 1  Canthus 2.5  2.5 1  Temporalis      Masseter      Procerus      Zygomaticus Major      TOTAL UNITS:   12.5    Agent: Botulinum Type A ( Onobotulinum Toxin type A ).  1 vials of Botox were used, each containing 50 units and freshly diluted with 2 mL of sterile, non-perserved saline   Total injected (Units): 12.5  Total wasted (Units): 27.5 Pt tolerated procedure well without complications.   Reinjection is anticipated in 3 months.

## 2016-01-12 ENCOUNTER — Encounter: Payer: Self-pay | Admitting: Neurology

## 2016-01-16 ENCOUNTER — Encounter: Payer: Self-pay | Admitting: Neurology

## 2016-01-16 ENCOUNTER — Ambulatory Visit (INDEPENDENT_AMBULATORY_CARE_PROVIDER_SITE_OTHER): Payer: BC Managed Care – PPO | Admitting: Neurology

## 2016-01-16 VITALS — BP 120/62 | HR 65 | Ht 67.0 in | Wt 173.0 lb

## 2016-01-16 DIAGNOSIS — G518 Other disorders of facial nerve: Secondary | ICD-10-CM

## 2016-01-16 DIAGNOSIS — G5139 Clonic hemifacial spasm, unspecified: Secondary | ICD-10-CM

## 2016-01-16 NOTE — Progress Notes (Signed)
Olivia Cole was seen today in neurologic consultation at the request of Emeterio ReeveWOLTERS,SHARON A, MD.  The patient is seen today in neurologic consultation for hemifacial spasm.  I have reviewed records that are available to me.  She is apparently had this since the age of 60 years old and actually sought consultation with Dr. Sandria ManlyLove in 2003.  At that time, she opted not to do anything in terms of treatment.  She began to see Dr. Terrace ArabiaYan because the symptoms were becoming more bothersome, so much so that it would cause near functional blindness.  The patient was ready to start treatment with Botox in the form of Xeomin.  She had her first and only series of injections on 08/02/2015.  50 units were injected.  The muscles included orbicularis oculi, frontalis, corrugator and procerus.  The patient complained of near syncope after the injections, which according to notes improved after she laid down.  Pt states that it was much more painful than she thought but also states that she was in bed for several days after.  Also admits that she was congested and was on mucinex and felt bad and perhaps that is why she felt bad.  States that she ended up on antibiotic for congestion and thinks that maybe this is why she was in bed.  She states that xeomin helped by 60%.  She is still having some twitch.  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  Very rarely she will have a pulling on the right cheeck.    The patient did have an MRI of the brain in 2003 that was done with and without gadolinium.  I do not have films.  I did review the report and it was reported to be normal.  01/16/16 update:  The patient follows up today regarding the Botox that she had done on 12/01/2015.  This was done for left hemifacial spasm.  She had previously had this done by Dr. Terrace ArabiaYan on 08/02/2015, although her experience at that time was not very good and she had a near syncopal episode.  She did well when we injected her  on 12/01/2015.  Since that time, she states that she has done well with just occasional twitching; however, she recently made a video birthday card for someone and noted that the right eye was smaller than the left which was injected.  They blink evenly.   ALLERGIES:  No Known Allergies  CURRENT MEDICATIONS:  Outpatient Encounter Prescriptions as of 01/16/2016  Medication Sig  . cholecalciferol (VITAMIN D) 1000 units tablet Take 1,000 Units by mouth daily.  . Multiple Vitamin (MULTIVITAMIN) tablet Take 1 tablet by mouth daily.   No facility-administered encounter medications on file as of 01/16/2016.    PAST MEDICAL HISTORY:   Past Medical History  Diagnosis Date  . Seasonal allergies     rare  . Ganglion cyst     right anterior hand-no problem  . Anemia     past hx. anemia  . Lumbar herniated disc     L5-S1  . Blepharitis of eyelid of left eye     PAST SURGICAL HISTORY:   Past Surgical History  Procedure Laterality Date  . Tonsillectomy  1982  . Cesarean section  1988  . Anterior cruciate ligament repair Left 1998  . Lasik    . Lumbar laminectomy/decompression microdiscectomy Left 09/01/2014    Procedure: HEMI LAMINECTOMY MICRODISCECTOMY L5-S1 LEFT ;  Surgeon: Jacki Conesonald A Gioffre, MD;  Location: [facility];  Service: Orthopedics;  Laterality: Left;    SOCIAL HISTORY:   Social History   Social History  . Marital Status: Married    Spouse Name: N/A  . Number of Children: 3  . 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 Smoking status: Never Smoker   . Smokeless tobacco: Never Used  . Alcohol Use: 4.2 oz/week    7 Glasses of wine per week     Comment: One glass of wine daily.  . Drug Use: No  . Sexual Activity: Yes   Other Topics Concern  . Not on file   Social History Narrative   Lives at home with her husband.   Right-handed.   2 cups caffeine per day.    FAMILY HISTORY:   Family Status    Relation Status Death Age  . Mother Alive     healthy  . Father Deceased 19    Prostate cancer, dementia  . Brother Alive     healthy  . Son Alive     healthy  . Son Alive     healthy  . Daughter Alive     healthy    ROS:  A complete 10 system review of systems was obtained and was unremarkable apart from what is mentioned above.  PHYSICAL EXAMINATION:    VITALS:   Filed Vitals:   01/16/16 0837  BP: 120/62  Pulse: 65  Height:  (1.702 m)  Weight: 173 lb (78.472 kg)    GEN:  Normal appears female in no acute distress.  Appears stated age. HEENT:  Normocephalic, atraumatic. The mucous membranes are moist. The superficial temporal arteries are without ropiness or tenderness. Cardiovascular: Regular rate and rhythm. Lungs: Clear to auscultation bilaterally. Neck/Heme: There are no carotid bruits noted bilaterally.  NEUROLOGICAL: Orientation:  The patient is alert and oriented x 3.   Cranial nerves: There is good facial symmetry with the exception of the fact that the L palpebral fissure is wider than the right (appears since botox as looked at pics from her phone pre-botox). The pupils are equal round and reactive to light bilaterally. Fundoscopic exam reveals clear disc margins bilaterally. Extraocular muscles are intact and visual fields are full to confrontational testing. Speech is fluent and clear. Soft palate rises symmetrically and there is no tongue deviation. Hearing is intact to conversational tone. Tone: Tone is good throughout. Sensation: Sensation is intact to light touch and pinprick throughout (facial, trunk, extremities). Vibration is intact at the bilateral big toe. There is no extinction with double simultaneous stimulation. There is no sensory dermatomal level identified. Coordination:  The patient has no difficulty with RAM's or FNF bilaterally. Motor: Strength is 5/5 in the bilateral upper and lower extremities.  Shoulder shrug is equal and symmetric.  There is no pronator drift.  There are no fasciculations noted. DTR's: Deep tendon reflexes are 2/4 at the bilateral biceps, triceps, brachioradialis, 2- at the bilateral patella and achilles.  Plantar responses are downgoing bilaterally. Gait and Station: The patient is able to ambulate without difficulty. The patient is able to heel toe walk without any difficulty. The patient is able to ambulate in a tandem fashion. The patient is able to stand in the Romberg position. Abnormal movements:  Minimal (very minimal) facial spasm is noted on the L and couldn't elicit it by activating mm of facial expression.  Mm of facial expression were strong.  IMPRESSION/PLAN  1. Left hemifacial spasm   -Did well with her last series of injections but thought that when she looked at a video of herself the L palpebral fissure was wider than the right.  I had actually noted that the last visit too.  Told her we could try to put a little botox in the other side but wouldn't do a lot (perhaps in lateral, upper lateral and lower lateral canthus) if she would like.  She was agreeable.

## 2016-03-14 ENCOUNTER — Ambulatory Visit: Payer: BC Managed Care – PPO | Admitting: Neurology

## 2016-03-15 ENCOUNTER — Ambulatory Visit: Payer: BC Managed Care – PPO | Admitting: Neurology

## 2016-04-19 ENCOUNTER — Ambulatory Visit (INDEPENDENT_AMBULATORY_CARE_PROVIDER_SITE_OTHER): Payer: BC Managed Care – PPO | Admitting: Neurology

## 2016-04-19 DIAGNOSIS — G518 Other disorders of facial nerve: Secondary | ICD-10-CM | POA: Diagnosis not present

## 2016-04-19 DIAGNOSIS — G5139 Clonic hemifacial spasm, unspecified: Secondary | ICD-10-CM

## 2016-04-19 MED ORDER — ONABOTULINUMTOXINA 100 UNITS IJ SOLR
15.0000 [IU] | Freq: Once | INTRAMUSCULAR | Status: AC
  Administered 2016-04-19: 15 [IU] via INTRAMUSCULAR

## 2016-04-19 NOTE — Procedures (Signed)
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  Pt pleased with injections Adverse Effects: n/a   Consent obtained from: The patient Benefits discussed included, but were not limited to decreased muscle tightness, increased joint range of motion, and decreased pain.  Risk discussed included, but were not limited pain and discomfort, bleeding, bruising, excessive weakness, venous thrombosis, muscle atrophy and dysphagia.  A copy of the patient medication guide was given to the patient which explains the blackbox warning.  Patients identity and treatment sites confirmed Yes.  .  Details of Procedure: Skin was cleaned with alcohol.   Prior to injection, the needle plunger was aspirated to make sure the needle was not within a blood vessel.  There was no blood retrieved on aspiration.    Following is a summary of the muscles injected  And the amount of Botulinum toxin used:  Injections  Location Left  Right Units Number of sites        Corrugator      Frontalis      Lower Lid, Lateral 2.5  2.5 1  Lower Lid Medial 2.5  2.5 1  Upper Lid, Lateral 2.5  2.5 1  Upper Lid, Medial 2.5  2.5 1  Canthus 2.5 2.5 5.0 1 each side  Temporalis      Masseter      Procerus      Zygomaticus Major      TOTAL UNITS:   15.0    Agent: Botulinum Type A ( Onobotulinum Toxin type A ).  1 vials of Botox were used, each containing 50 units and freshly diluted with 2 mL of sterile, non-perserved saline   Total injected (Units): 15.0  Total wasted (Units): 0 Pt tolerated procedure well without complications.   Reinjection is anticipated in 3 months.

## 2016-06-07 ENCOUNTER — Ambulatory Visit: Payer: BC Managed Care – PPO | Admitting: Neurology

## 2016-07-19 ENCOUNTER — Ambulatory Visit: Payer: BC Managed Care – PPO | Admitting: Neurology

## 2016-07-30 ENCOUNTER — Other Ambulatory Visit: Payer: Self-pay | Admitting: Urology

## 2016-07-30 DIAGNOSIS — N134 Hydroureter: Secondary | ICD-10-CM

## 2016-08-01 ENCOUNTER — Ambulatory Visit: Payer: BC Managed Care – PPO | Admitting: Neurology

## 2016-08-06 ENCOUNTER — Encounter (HOSPITAL_COMMUNITY): Payer: BC Managed Care – PPO

## 2016-08-08 ENCOUNTER — Encounter (HOSPITAL_COMMUNITY): Payer: Self-pay

## 2016-08-09 ENCOUNTER — Encounter (HOSPITAL_COMMUNITY)
Admission: RE | Admit: 2016-08-09 | Discharge: 2016-08-09 | Disposition: A | Discharge status: Home / Self Care | Payer: BC Managed Care – PPO | Source: Ambulatory Visit | Attending: Urology | Admitting: Urology

## 2016-08-09 DIAGNOSIS — N134 Hydroureter: Secondary | ICD-10-CM | POA: Insufficient documentation

## 2016-08-09 MED ORDER — FUROSEMIDE 10 MG/ML IJ SOLN
40.0000 mg | Freq: Once | INTRAMUSCULAR | Status: AC
  Administered 2016-08-09: 35 mg via INTRAVENOUS

## 2016-08-09 MED ORDER — FUROSEMIDE 10 MG/ML IJ SOLN
INTRAMUSCULAR | Status: AC
Start: 2016-08-09 — End: 2016-08-09
  Filled 2016-08-09: qty 4

## 2016-08-09 MED ORDER — TECHNETIUM TC 99M MERTIATIDE: 5.0000 | Freq: Once | INTRAVENOUS | Status: DC | PRN

## 2016-08-12 ENCOUNTER — Other Ambulatory Visit (HOSPITAL_COMMUNITY): Payer: Self-pay | Admitting: Gastroenterology

## 2016-08-12 DIAGNOSIS — R1084 Generalized abdominal pain: Secondary | ICD-10-CM

## 2016-08-12 DIAGNOSIS — R932 Abnormal findings on diagnostic imaging of liver and biliary tract: Secondary | ICD-10-CM

## 2016-08-19 ENCOUNTER — Ambulatory Visit (HOSPITAL_COMMUNITY): Payer: BC Managed Care – PPO

## 2016-08-21 ENCOUNTER — Ambulatory Visit (HOSPITAL_COMMUNITY): Payer: BC Managed Care – PPO

## 2016-08-21 ENCOUNTER — Ambulatory Visit (HOSPITAL_COMMUNITY): Admission: RE | Admit: 2016-08-21 | Payer: BC Managed Care – PPO | Source: Ambulatory Visit

## 2016-08-30 ENCOUNTER — Encounter: Payer: Self-pay | Admitting: Neurology

## 2016-08-30 ENCOUNTER — Ambulatory Visit (INDEPENDENT_AMBULATORY_CARE_PROVIDER_SITE_OTHER): Payer: BC Managed Care – PPO | Admitting: Neurology

## 2016-08-30 DIAGNOSIS — G5139 Clonic hemifacial spasm, unspecified: Secondary | ICD-10-CM

## 2016-08-30 DIAGNOSIS — G513 Clonic hemifacial spasm: Secondary | ICD-10-CM

## 2016-08-30 DIAGNOSIS — G245 Blepharospasm: Secondary | ICD-10-CM

## 2016-08-30 MED ORDER — ONABOTULINUMTOXINA 100 UNITS IJ SOLR
15.0000 [IU] | Freq: Once | INTRAMUSCULAR | Status: AC
  Administered 2016-08-30: 15 [IU] via INTRAMUSCULAR

## 2016-08-30 NOTE — Procedures (Signed)
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  Risk discussed included, but were not limited pain and discomfort, bleeding, bruising, excessive weakness, venous thrombosis, muscle atrophy and dysphagia.  A copy of the patient medication guide was given to the patient which explains the blackbox warning.  Patients identity and treatment sites confirmed Yes.  .  Details of Procedure: Skin was cleaned with alcohol.   Prior to injection, the needle plunger was aspirated to make sure the needle was not within a blood vessel.  There was no blood retrieved on aspiration.    Following is a summary of the muscles injected  And the amount of Botulinum toxin used:  Injections  Location Left  Right Units Number of sites        Corrugator      Frontalis      Lower Lid, Lateral 2.5  2.5 1  Lower Lid Medial 2.5  2.5 1  Upper Lid, Lateral 2.5  2.5 1  Upper Lid, Medial 2.5  2.5 1  Canthus 2.5 2.5 5.0 1 each side  Temporalis      Masseter      Procerus      Zygomaticus Major      TOTAL UNITS:   15.0    Agent: Botulinum Type A ( Onobotulinum Toxin type A ).  1 vials of Botox were used, each containing 50 units and freshly diluted with 2 mL of sterile, non-perserved saline   Total injected (Units): 15.0  Total wasted (Units): 0 Pt tolerated procedure well without complications.   Reinjection is anticipated in 3 months.

## 2016-09-18 ENCOUNTER — Other Ambulatory Visit (HOSPITAL_COMMUNITY): Payer: Self-pay | Admitting: Gastroenterology

## 2016-09-18 ENCOUNTER — Ambulatory Visit (HOSPITAL_COMMUNITY)
Admission: RE | Admit: 2016-09-18 | Discharge: 2016-09-18 | Disposition: A | Discharge status: Home / Self Care | Payer: BC Managed Care – PPO | Source: Ambulatory Visit | Attending: Gastroenterology | Admitting: Gastroenterology

## 2016-09-18 DIAGNOSIS — R1084 Generalized abdominal pain: Secondary | ICD-10-CM

## 2016-09-18 DIAGNOSIS — R932 Abnormal findings on diagnostic imaging of liver and biliary tract: Secondary | ICD-10-CM

## 2016-09-18 DIAGNOSIS — K869 Disease of pancreas, unspecified: Secondary | ICD-10-CM | POA: Insufficient documentation

## 2016-09-18 MED ORDER — GADOBENATE DIMEGLUMINE 529 MG/ML IV SOLN
16.0000 mL | Freq: Once | INTRAVENOUS | Status: AC | PRN
  Administered 2016-09-18: 16 mL via INTRAVENOUS

## 2016-11-29 ENCOUNTER — Ambulatory Visit (INDEPENDENT_AMBULATORY_CARE_PROVIDER_SITE_OTHER): Payer: BC Managed Care – PPO | Admitting: Neurology

## 2016-11-29 DIAGNOSIS — G513 Clonic hemifacial spasm: Secondary | ICD-10-CM | POA: Diagnosis not present

## 2016-11-29 DIAGNOSIS — G5139 Clonic hemifacial spasm, unspecified: Secondary | ICD-10-CM

## 2016-11-29 DIAGNOSIS — G245 Blepharospasm: Secondary | ICD-10-CM

## 2016-11-29 MED ORDER — ONABOTULINUMTOXINA 100 UNITS IJ SOLR
15.0000 [IU] | Freq: Once | INTRAMUSCULAR | Status: AC
  Administered 2016-11-29: 15 [IU] via INTRAMUSCULAR

## 2016-11-29 NOTE — Procedures (Signed)
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  Risk discussed included, but were not limited pain and discomfort, bleeding, bruising, excessive weakness, venous thrombosis, muscle atrophy and dysphagia.  A copy of the patient medication guide was given to the patient which explains the blackbox warning.  Patients identity and treatment sites confirmed Yes.  .  Details of Procedure: Skin was cleaned with alcohol.   Prior to injection, the needle plunger was aspirated to make sure the needle was not within a blood vessel.  There was no blood retrieved on aspiration.    Following is a summary of the muscles injected  And the amount of Botulinum toxin used:  Injections  Location Left  Right Units Number of sites        Corrugator      Frontalis      Lower Lid, Lateral 2.5  2.5 1  Lower Lid Medial 2.5  2.5 1  Upper Lid, Lateral 2.5  2.5 1  Upper Lid, Medial 2.5  2.5 1  Canthus 2.5 2.5 5.0 1 each side  Temporalis      Masseter      Procerus      Zygomaticus Major      TOTAL UNITS:   15.0    Agent: Botulinum Type A ( Onobotulinum Toxin type A ).  1 vials of Botox were used, each containing 50 units and freshly diluted with 2 mL of sterile, non-perserved saline   Total injected (Units): 15.0  Total wasted (Units): 5.0 Pt tolerated procedure well without complications.   Reinjection is anticipated in 3 months.

## 2017-01-20 ENCOUNTER — Other Ambulatory Visit: Payer: Self-pay | Admitting: Otolaryngology

## 2017-02-28 ENCOUNTER — Ambulatory Visit: Payer: BC Managed Care – PPO | Admitting: Neurology

## 2017-04-10 ENCOUNTER — Ambulatory Visit (INDEPENDENT_AMBULATORY_CARE_PROVIDER_SITE_OTHER): Payer: BC Managed Care – PPO | Admitting: Neurology

## 2017-04-10 DIAGNOSIS — G245 Blepharospasm: Secondary | ICD-10-CM | POA: Diagnosis not present

## 2017-04-10 MED ORDER — ONABOTULINUMTOXINA 100 UNITS IJ SOLR
15.0000 [IU] | Freq: Once | INTRAMUSCULAR | Status: AC
  Administered 2017-04-10: 15 [IU] via INTRAMUSCULAR

## 2017-04-10 NOTE — Procedures (Signed)
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  Risk discussed included, but were not limited pain and discomfort, bleeding, bruising, excessive weakness, venous thrombosis, muscle atrophy and dysphagia.  A copy of the patient medication guide was given to the patient which explains the blackbox warning.  Patients identity and treatment sites confirmed Yes.  .  Details of Procedure: Skin was cleaned with alcohol.   Prior to injection, the needle plunger was aspirated to make sure the needle was not within a blood vessel.  There was no blood retrieved on aspiration.    Following is a summary of the muscles injected  And the amount of Botulinum toxin used:  Injections  Location Left  Right Units Number of sites        Corrugator      Frontalis      Lower Lid, Lateral 2.5  2.5 1  Lower Lid Medial 2.5  2.5 1  Upper Lid, Lateral 2.5  2.5 1  Upper Lid, Medial 2.5  2.5 1  Canthus 2.5 2.5 5.0 1 each side  Temporalis      Masseter      Procerus      Zygomaticus Major      TOTAL UNITS:   15.0    Agent: Botulinum Type A ( Onobotulinum Toxin type A ).  1 vials of Botox were used, each containing 50 units and freshly diluted with 2 mL of sterile, non-perserved saline   Total injected (Units): 15.0  Total wasted (Units): 15 Pt tolerated procedure well without complications.   Reinjection is anticipated in 4 months.

## 2017-04-23 IMAGING — MR MR 3D RECON AT [facility]
20 of 21 series · 20 of 21 positions shown · IV contrast (multihance)
Comparison: None.

CLINICAL DATA: Intermittent determined pancreatic lesion on
comparison noncontrast CT.

EXAM:
MRI ABDOMEN WITHOUT AND WITH CONTRAST (INCLUDING MRCP)
TECHNIQUE: Multiplanar multisequence MR imaging of the abdomen was performed
both before and after the administration of intravenous contrast.
Heavily T2-weighted images of the biliary and pancreatic ducts were
obtained, and three-dimensional MRCP images were rendered by post
processing.
CONTRAST:  16mL MULTIHANCE GADOBENATE DIMEGLUMINE 529 MG/ML IV SOLN

[Series 3: T2 fat-sat · axial · 5.0mm · 0.70mm/px · 1 of 45 slices shown]
[im 1/45]
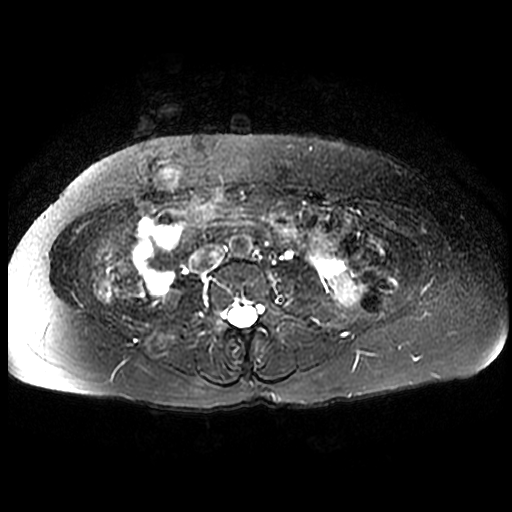

[Series 4: T2 · axial · 5.0mm · 0.70mm/px · 1 of 50 slices shown (1 of 2)]
[im 1/50]
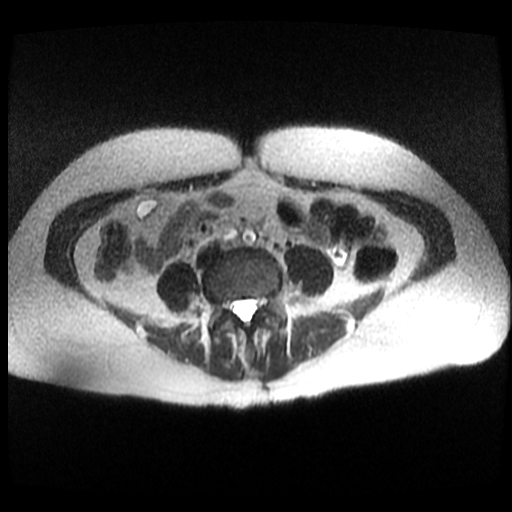

[Series 5: T2 · coronal · 5.0mm · 0.70mm/px · 1 of 31 slices shown (2 of 2)]
[im 1/31]
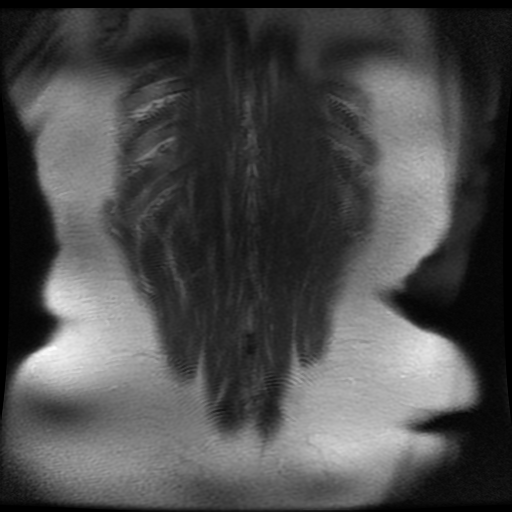

[Series 6: MRCP · coronal · 1.6mm · 0.62mm/px · 1 of 96 slices shown (1 of 3)]
[im 1/96]
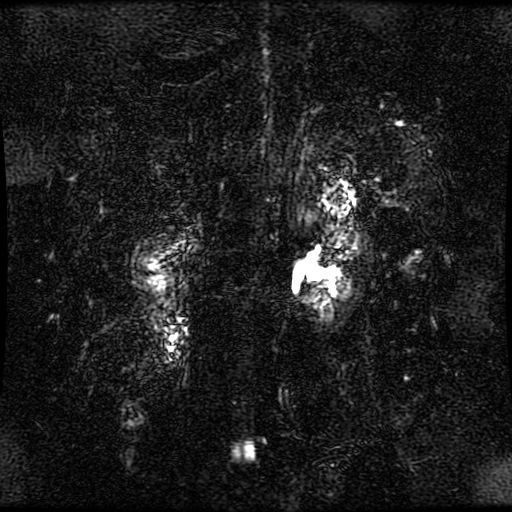

[Series 7: MRCP · coronal · 2.0mm · 0.70mm/px · 1 of 42 slices shown (2 of 3)]
[im 1/42]
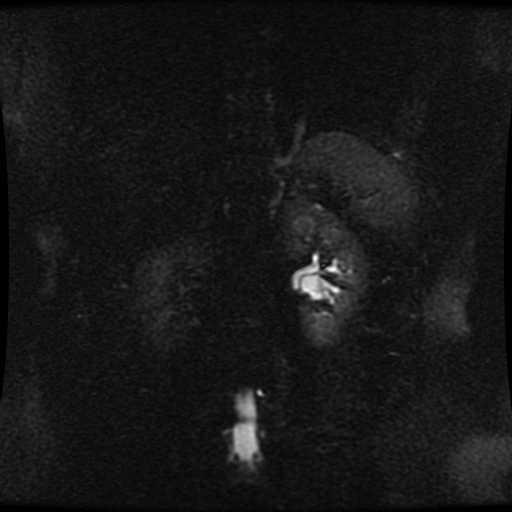

[Series 8: DWI b500 · axial · 6.0mm · 1.48mm/px · 1 of 60 slices shown]
[im 1/60]
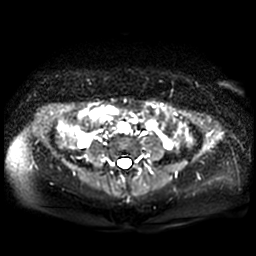

[Series 11: ax dualecho · axial · 5.0mm · 0.70mm/px · 1 of 80 slices shown]
[im 1/80]
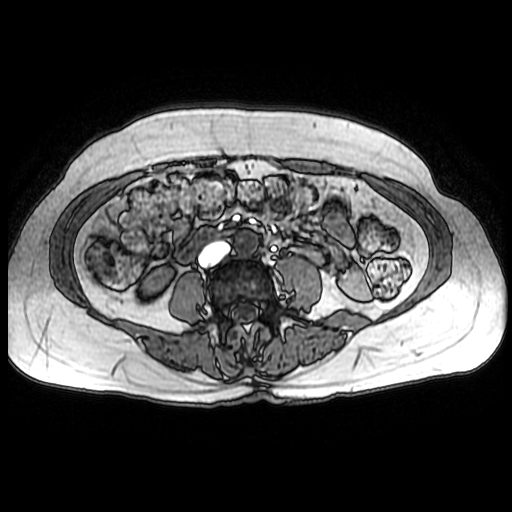

[Series 12: MRCP · coronal · 50.0mm · 0.70mm/px · 1 of 5 slices shown (3 of 3)]
[im 1/5]
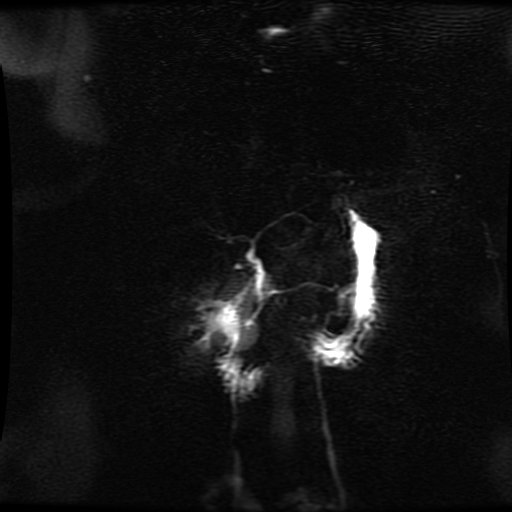

[Series 14: T1 dynamic post-contrast · coronal · 5.0mm · 0.70mm/px · 1 of 64 slices shown]
[im 1/64]
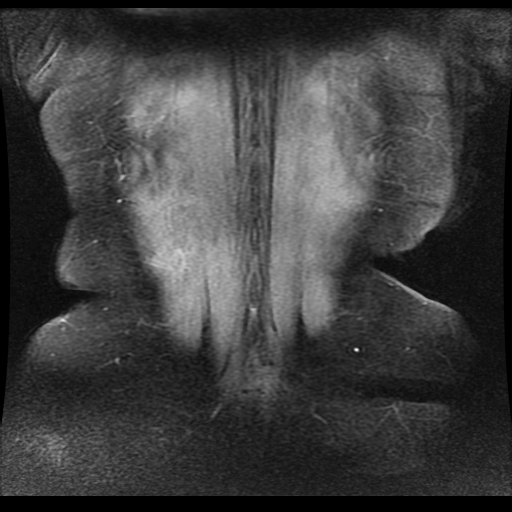

[Series 601: processed images · axial · 19.4mm · 0.62mm/px · 1 of 90 slices shown]
[im 1/90]
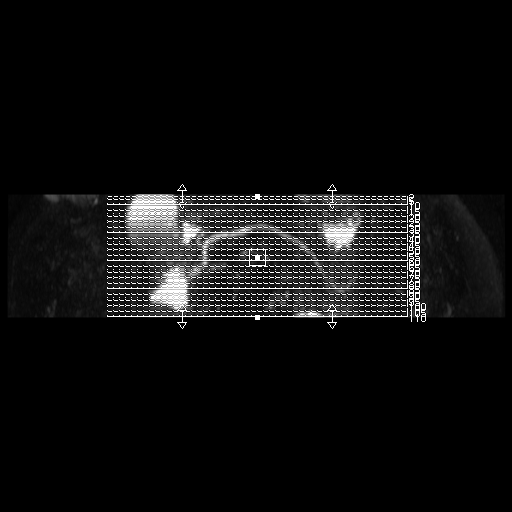

[Series 800: DWI · axial · 6.0mm · 1.48mm/px · 1 of 30 slices shown]
[im 1/30]
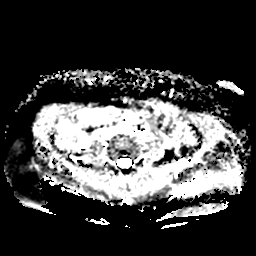

[Series 1300: T1 dynamic · axial · 5.0mm · 0.78mm/px · 1 of 88 slices shown (1 of 5)]
[im 1/88]
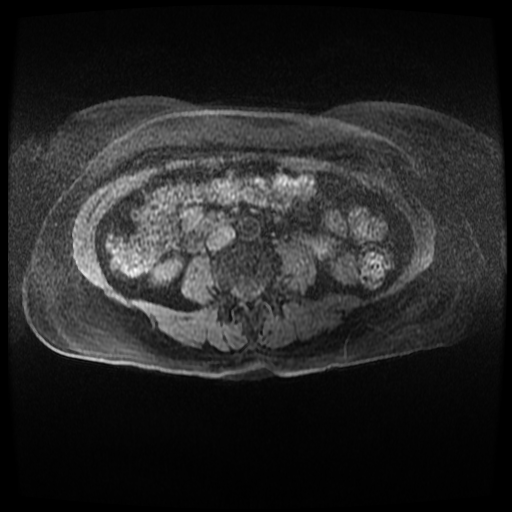

[Series 1301: T1 dynamic · axial · 5.0mm · 0.78mm/px · 1 of 88 slices shown (2 of 5)]
[im 1/88]
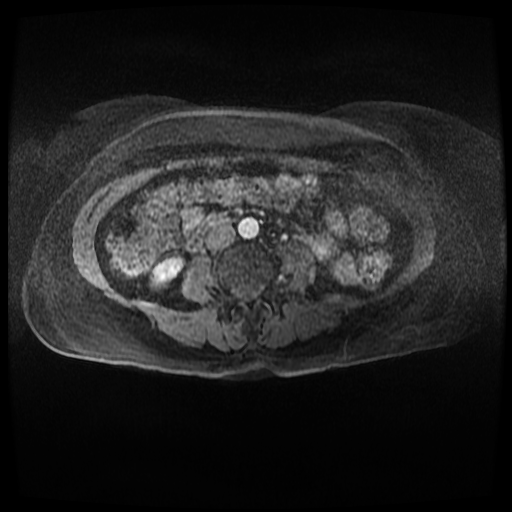

[Series 1302: T1 dynamic · axial · 5.0mm · 0.78mm/px · 1 of 88 slices shown (3 of 5)]
[im 1/88]
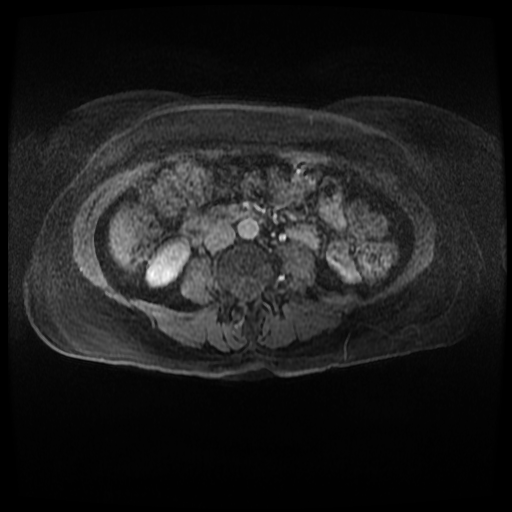

[Series 1303: T1 dynamic · axial · 5.0mm · 0.78mm/px · 1 of 88 slices shown (4 of 5)]
[im 1/88]
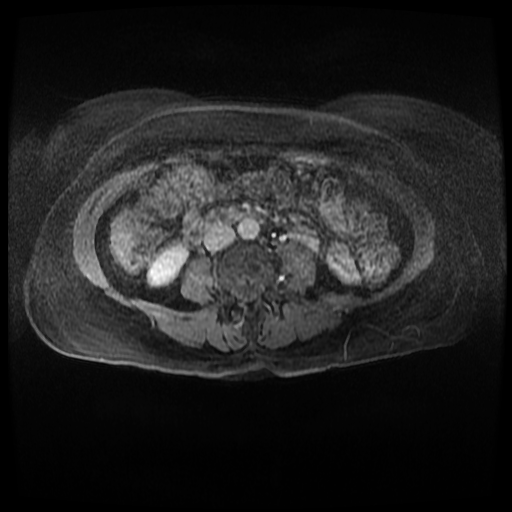

[Series 1304: T1 dynamic · axial · 5.0mm · 0.78mm/px · 1 of 88 slices shown (5 of 5)]
[im 1/88]
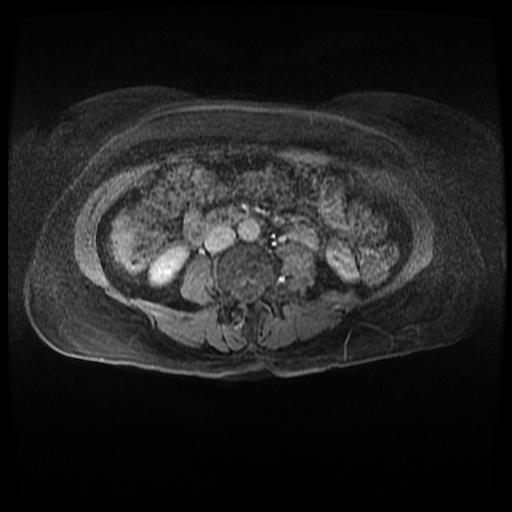

[((id)/(id)/1)-((id)/(id)/1) · axial · 5.0mm · 0.78mm/px · 1 of 87 slices shown (1 of 4)]
[im 1/87]
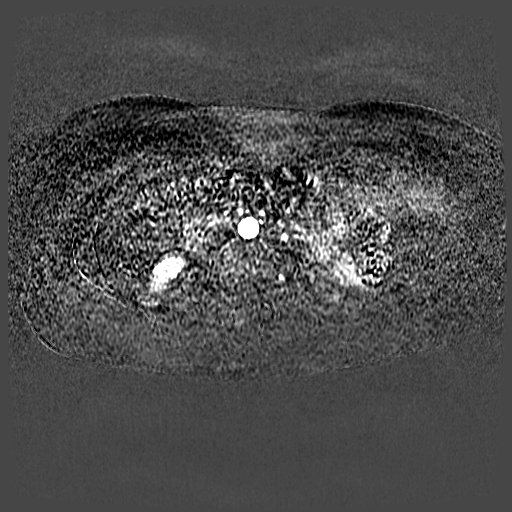

[((id)/(id)/1)-((id)/(id)/1) · axial · 5.0mm · 0.78mm/px · 1 of 88 slices shown (2 of 4)]
[im 1/88]
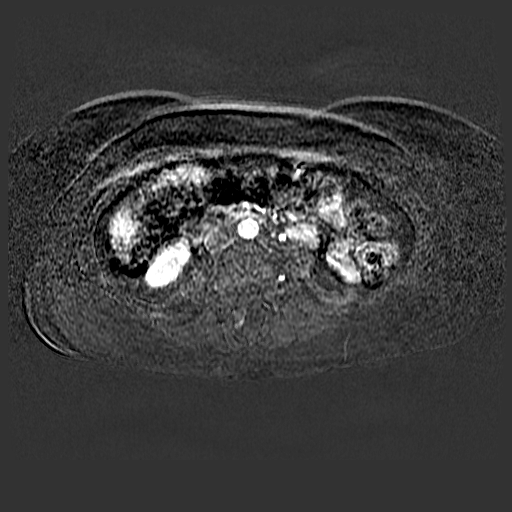

[((id)/(id)/1)-((id)/(id)/1) · axial · 5.0mm · 0.78mm/px · 1 of 88 slices shown (3 of 4)]
[im 1/88]
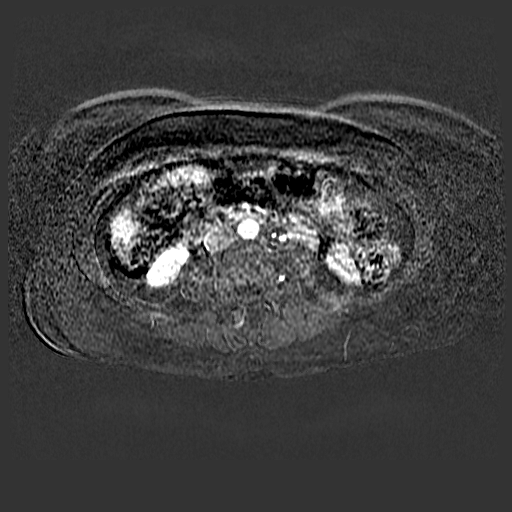

[((id)/(id)/1)-((id)/(id)/1) · axial · 5.0mm · 0.78mm/px · 1 of 88 slices shown (4 of 4)]
[im 1/88]
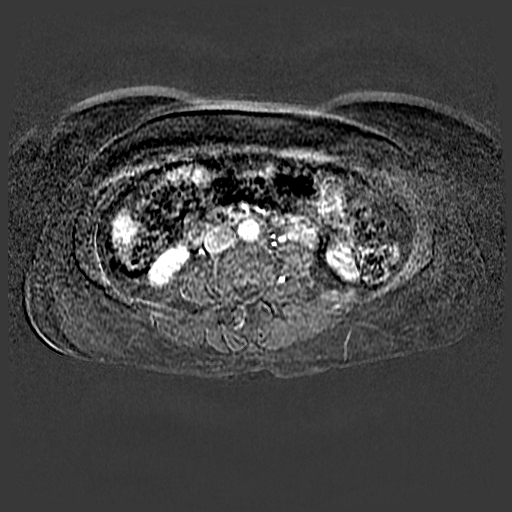

[20 of 21 positions shown; findings below may reference images not displayed]

FINDINGS: Lower chest: Lung bases are clear.

Hepatobiliary: No focal hepatic lesion. No intrahepatic duct
dilatation. Common bile duct normal caliber. Normal gallbladder.

Pancreas: There is focal fatty infiltration in the pancreatic head
(image 30, series 4 and image 69, series 11). No clear pancreatic
inflammation. No pancreatic duct dilatation. Normal pancreatic
ductal anatomy.

Spleen:  Normal spleen.

Adrenals/Urinary Tract:  Adrenal glands and kidneys are normal.

Stomach/Bowel: Limited view of the small bowel and: Unremarkable.

Vascular/Lymphatic: Abdominal or is normal caliber. No
retroperitoneal lymphadenopathy

Other:  No free-fluid

Musculoskeletal: No aggressive osseous lesion.
IMPRESSION: 1. Focal fatty infiltration pancreatic head. No evidence acute
pancreatitis. No obstructing lesion identified.
2. Normal liver and biliary tree.

## 2017-06-27 ENCOUNTER — Telehealth: Payer: Self-pay | Admitting: Neurology

## 2017-06-27 NOTE — Telephone Encounter (Signed)
Patient called regarding her next Botox appointment in November. She said her eye has been twitching more and wanted to know was there a Botox day available in September. Please Advise. Thanks

## 2017-06-27 NOTE — Telephone Encounter (Signed)
Patient made aware no Botox spots available but she can call for cancellations next week due to Botox day being next Friday. She expressed understanding. States she could only come late in the day.

## 2017-07-04 ENCOUNTER — Ambulatory Visit: Payer: BC Managed Care – PPO | Admitting: Neurology

## 2017-09-05 ENCOUNTER — Ambulatory Visit (INDEPENDENT_AMBULATORY_CARE_PROVIDER_SITE_OTHER): Payer: BC Managed Care – PPO | Admitting: Neurology

## 2017-09-05 DIAGNOSIS — G245 Blepharospasm: Secondary | ICD-10-CM | POA: Diagnosis not present

## 2017-09-05 DIAGNOSIS — G5139 Clonic hemifacial spasm, unspecified: Secondary | ICD-10-CM | POA: Diagnosis not present

## 2017-09-05 MED ORDER — ONABOTULINUMTOXINA 100 UNITS IJ SOLR
15.0000 [IU] | Freq: Once | INTRAMUSCULAR | Status: AC
  Administered 2017-09-05: 15 [IU] via INTRAMUSCULAR

## 2017-09-05 NOTE — Procedures (Signed)
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  Risk discussed included, but were not limited pain and discomfort, bleeding, bruising, excessive weakness, venous thrombosis, muscle atrophy and dysphagia.  A copy of the patient medication guide was given to the patient which explains the blackbox warning.  Patients identity and treatment sites confirmed Yes.  .  Details of Procedure: Skin was cleaned with alcohol.   Prior to injection, the needle plunger was aspirated to make sure the needle was not within a blood vessel.  There was no blood retrieved on aspiration.    Following is a summary of the muscles injected  And the amount of Botulinum toxin used:  Injections  Location Left  Right Units Number of sites        Corrugator      Frontalis      Lower Lid, Lateral 2.5  2.5 1  Lower Lid Medial 2.5  2.5 1  Upper Lid, Lateral 2.5  2.5 1  Upper Lid, Medial 2.5  2.5 1  Canthus 2.5 2.5 5.0 1 each side  Temporalis      Masseter      Procerus      Zygomaticus Major      TOTAL UNITS:   15.0    Agent: Botulinum Type A ( Onobotulinum Toxin type A ).  1 vials of Botox were used, each containing 50 units and freshly diluted with 2 mL of sterile, non-perserved saline   Total injected (Units): 15.0  Total wasted (Units): 0 Pt tolerated procedure well without complications.   Reinjection is anticipated in 4 months.

## 2018-01-16 ENCOUNTER — Ambulatory Visit (INDEPENDENT_AMBULATORY_CARE_PROVIDER_SITE_OTHER): Payer: BC Managed Care – PPO | Admitting: Neurology

## 2018-01-16 DIAGNOSIS — G5139 Clonic hemifacial spasm, unspecified: Secondary | ICD-10-CM

## 2018-01-16 MED ORDER — ONABOTULINUMTOXINA 100 UNITS IJ SOLR
15.0000 [IU] | Freq: Once | INTRAMUSCULAR | Status: AC
  Administered 2018-01-16: 15 [IU] via INTRAMUSCULAR

## 2018-01-16 NOTE — Procedures (Signed)
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    Objective:  Ptosis with twitching of the L eye.  Consent obtained from: The patient Benefits discussed included, but were not limited to decreased muscle tightness, increased joint range of motion, and decreased pain.  Risk discussed included, but were not limited pain and discomfort, bleeding, bruising, excessive weakness, venous thrombosis, muscle atrophy and dysphagia.  A copy of the patient medication guide was given to the patient which explains the blackbox warning.  Patients identity and treatment sites confirmed Yes.  .  Details of Procedure: Skin was cleaned with alcohol.   Prior to injection, the needle plunger was aspirated to make sure the needle was not within a blood vessel.  There was no blood retrieved on aspiration.    Following is a summary of the muscles injected  And the amount of Botulinum toxin used:  Injections  Location Left  Right Units Number of sites        Corrugator      Frontalis      Lower Lid, Lateral 2.5  2.5 1  Lower Lid Medial 2.5  2.5 1  Upper Lid, Lateral 2.5  2.5 1  Upper Lid, Medial 2.5  2.5 1  Canthus 2.5 2.5 5.0 1 each side  Temporalis      Masseter      Procerus      Zygomaticus Major      TOTAL UNITS:   15.0    Agent: Botulinum Type A ( Onobotulinum Toxin type A ).  1 vials of Botox were used, each containing 50 units and freshly diluted with 2 mL of sterile, non-perserved saline   Total injected (Units): 15.0  Total wasted (Units): 0 Pt tolerated procedure well without complications.   Reinjection is anticipated in 3 months.

## 2018-04-23 ENCOUNTER — Ambulatory Visit (INDEPENDENT_AMBULATORY_CARE_PROVIDER_SITE_OTHER): Payer: BC Managed Care – PPO | Admitting: Neurology

## 2018-04-23 DIAGNOSIS — G5139 Clonic hemifacial spasm, unspecified: Secondary | ICD-10-CM

## 2018-04-23 MED ORDER — ONABOTULINUMTOXINA 100 UNITS IJ SOLR: 100.0000 [IU] | Freq: Once | INTRAMUSCULAR | Status: DC

## 2018-04-23 MED ORDER — ONABOTULINUMTOXINA 100 UNITS IJ SOLR
15.0000 [IU] | Freq: Once | INTRAMUSCULAR | Status: AC
  Administered 2018-04-23: 15 [IU] via INTRAMUSCULAR

## 2018-04-23 NOTE — Procedures (Signed)
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  Consent obtained from: The patient Benefits discussed included, but were not limited to decreased muscle tightness, increased joint range of motion, and decreased pain.  Risk discussed included, but were not limited pain and discomfort, bleeding, bruising, excessive weakness, venous thrombosis, muscle atrophy and dysphagia.  A copy of the patient medication guide was given to the patient which explains the blackbox warning.  Patients identity and treatment sites confirmed Yes.  .  Details of Procedure: Skin was cleaned with alcohol.   Prior to injection, the needle plunger was aspirated to make sure the needle was not within a blood vessel.  There was no blood retrieved on aspiration.    Following is a summary of the muscles injected  And the amount of Botulinum toxin used:  Injections  Location Left  Right Units Number of sites        Corrugator      Frontalis      Lower Lid, Lateral 2.5  2.5 1  Lower Lid Medial 2.5  2.5 1  Upper Lid, Lateral 2.5  2.5 1  Upper Lid, Medial 2.5  2.5 1  Canthus 2.5 2.5 5.0 1 each side  Temporalis      Masseter      Procerus      Zygomaticus Major      TOTAL UNITS:   15.0    Agent: Botulinum Type A ( Onobotulinum Toxin type A ).  1 vials of Botox were used, each containing 50 units and freshly diluted with 2 mL of sterile, non-perserved saline   Total injected (Units): 15.0  Total wasted (Units): 0 Pt tolerated procedure well without complications.   Reinjection is anticipated in 3 months.

## 2018-07-17 ENCOUNTER — Ambulatory Visit (INDEPENDENT_AMBULATORY_CARE_PROVIDER_SITE_OTHER): Payer: BC Managed Care – PPO | Admitting: Neurology

## 2018-07-17 DIAGNOSIS — G5139 Clonic hemifacial spasm, unspecified: Secondary | ICD-10-CM | POA: Diagnosis not present

## 2018-07-17 MED ORDER — ONABOTULINUMTOXINA 100 UNITS IJ SOLR
30.0000 [IU] | Freq: Once | INTRAMUSCULAR | Status: AC
  Administered 2018-07-17: 30 [IU] via INTRAMUSCULAR

## 2018-07-17 NOTE — Procedures (Signed)
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 Some noted today as well but mild   Consent obtained from: The patient Benefits discussed included, but were not limited to decreased muscle tightness, increased joint range of motion, and decreased pain.  Risk discussed included, but were not limited pain and discomfort, bleeding, bruising, excessive weakness, venous thrombosis, muscle atrophy and dysphagia.  A copy of the patient medication guide was given to the patient which explains the blackbox warning.  Patients identity and treatment sites confirmed Yes.  .  Details of Procedure: Skin was cleaned with alcohol.   Prior to injection, the needle plunger was aspirated to make sure the needle was not within a blood vessel.  There was no blood retrieved on aspiration.    Following is a summary of the muscles injected  And the amount of Botulinum toxin used:  Injections  Location Left  Right Units Number of sites        Corrugator      Frontalis      Lower Lid, Lateral 2.5  2.5 1  Lower Lid Medial 2.5  2.5 1  Upper Lid, Lateral 2.5  2.5 1  Upper Lid, Medial 2.5  2.5 1  Canthus 2.5 2.5 5.0 1 each side  Temporalis      Masseter      Procerus      Zygomaticus Major      TOTAL UNITS:   15.0    Agent: Botulinum Type A ( Onobotulinum Toxin type A ).  1 vials of Botox were used, each containing 50 units and freshly diluted with 2 mL of sterile, non-perserved saline   Total injected (Units): 15.0  Total wasted (Units): 15 Pt tolerated procedure well without complications.   Reinjection is anticipated in 3 months.

## 2018-11-19 ENCOUNTER — Other Ambulatory Visit: Payer: Self-pay | Admitting: Neurology

## 2018-11-19 ENCOUNTER — Telehealth: Payer: Self-pay | Admitting: Neurology

## 2018-11-19 MED ORDER — ONABOTULINUMTOXINA 100 UNITS IJ SOLR: 100.0000 [IU] | INTRAMUSCULAR | 3 refills | Status: DC

## 2018-11-19 NOTE — Telephone Encounter (Signed)
Documented in other open encounter.

## 2018-11-19 NOTE — Telephone Encounter (Signed)
Left message on machine for patient to call back.  To let her know her insurance now requires Botox to come from the [facility] ([facility]). To make her aware I am sending RX to them but she will need to approve it. Awaiting call back to discuss.

## 2018-11-19 NOTE — Telephone Encounter (Signed)
Patient left vm that she was returning your call?. Please call her back at [phone]. Thanks!

## 2018-11-19 NOTE — Telephone Encounter (Signed)
Spoke with patient. Made her aware. RX sent to [facility] at [phone] with confirmation received. Patient will look out to approve medication to be shipped to us. Will hopefully proceed with injection next week.

## 2018-11-27 ENCOUNTER — Ambulatory Visit: Payer: BC Managed Care – PPO | Admitting: Neurology

## 2019-01-22 ENCOUNTER — Encounter: Payer: Self-pay | Admitting: Neurology

## 2019-01-22 NOTE — Progress Notes (Signed)
Botox approval valid through 11/16/2019. Patient to use specialty pharmacy.

## 2019-06-28 ENCOUNTER — Other Ambulatory Visit: Payer: Self-pay

## 2019-06-28 DIAGNOSIS — Z20822 Contact with and (suspected) exposure to covid-19: Secondary | ICD-10-CM

## 2019-06-30 LAB — NOVEL CORONAVIRUS, NAA: SARS-CoV-2, NAA: NOT DETECTED

## 2019-08-12 ENCOUNTER — Ambulatory Visit (INDEPENDENT_AMBULATORY_CARE_PROVIDER_SITE_OTHER): Payer: BC Managed Care – PPO | Admitting: Allergy & Immunology

## 2019-08-12 ENCOUNTER — Encounter: Payer: Self-pay | Admitting: Allergy & Immunology

## 2019-08-12 ENCOUNTER — Other Ambulatory Visit: Payer: Self-pay

## 2019-08-12 VITALS — BP 130/70 | HR 68 | Temp 97.8°F | Resp 18 | Ht 66.5 in | Wt 167.4 lb

## 2019-08-12 DIAGNOSIS — J3089 Other allergic rhinitis: Secondary | ICD-10-CM

## 2019-08-12 DIAGNOSIS — J302 Other seasonal allergic rhinitis: Secondary | ICD-10-CM | POA: Diagnosis not present

## 2019-08-12 MED ORDER — AZELASTINE HCL 0.1 % NA SOLN: NASAL | 12 refills | Status: DC

## 2019-08-12 MED ORDER — FEXOFENADINE HCL 180 MG PO TABS: 180.0000 mg | ORAL_TABLET | Freq: Every day | ORAL | 5 refills | Status: DC

## 2019-08-12 MED ORDER — FLUTICASONE PROPIONATE 50 MCG/ACT NA SUSP: 2.0000 | Freq: Every day | NASAL | 5 refills | Status: DC

## 2019-08-12 NOTE — Progress Notes (Signed)
NEW PATIENT  Date of Service/Encounter:  08/12/19  Referring provider: Jonathon Jordan, MD   Assessment:   Seasonal and perennial allergic rhinitis (grasses, ragweed, weeds, trees, indoor molds, outdoor molds, dog and cockroach)  Plan/Recommendations:   1. Seasonal and perennial allergic rhinitis - Testing today showed: grasses, ragweed, weeds, trees, indoor molds, outdoor molds, dog and cockroach - Copy of test results provided.  - Avoidance measures provided. - Continue with: Flonase (fluticasone) two sprays per nostril daily - Start taking: Allegra (fexofenadine) 180mg  table once daily and Astelin (azelastine) 2 sprays per nostril 1-2 times daily as needed - You can use an extra dose of the antihistamine, if needed, for breakthrough symptoms.  - Consider nasal saline rinses 1-2 times daily to remove allergens from the nasal cavities as well as help with mucous clearance (this is especially helpful to do before the nasal sprays are given) - Consider allergy shots as a means of long-term control. - Allergy shots "re-train" and "reset" the immune system to ignore environmental allergens and decrease the resulting immune response to those allergens (sneezing, itchy watery eyes, runny nose, nasal congestion, etc).    - Allergy shots improve symptoms in 75-85% of patients.  - Call your insurance company and think about it and give [place] a call back.  2. Return in about 3 months (around 11/12/2019). This can be an in-person, a virtual Webex or a telephone follow up visit.   Subjective:   Olivia Cole is a 63 y.o. female presenting today for evaluation of  Chief Complaint  Patient presents with  . Sinusitis    Olivia Cole has a history of the following: Patient Active Problem List   Diagnosis Date Noted  . Facial spasm 08/02/2015  . Herniated lumbar intervertebral disc 09/01/2014    History obtained from: chart review and patient.  Olivia Cole was referred by  Jonathon Jordan, MD.     Olivia Cole is a 63 y.o. 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 She tells me that when she was 21, she moved to [facility] and developed allergies. Prior to this, she had recurrent Strep throat. She moved to [place] in 2001. She developed seasonal allergies and they do become fairly bad. She uses Flonase regularly which helps. Then two years ago, she had a root canal that "went bad". She had tooth surgery with Dr. Junita Push. But her sinuses were "impacted" and she eventually had sinus surgery that cleared it up. She thought she would be OK after that.   However, her congestion has since worsened. She is able to breathe without a problem. But every day, she has had "massive green gunk". She wants to find out what creates this "stuff" which is why she is here. She has never been allergy tested. She does report that the spring is the worst time of the year, but even now she is having issues. Symptoms do tend to get better in the winter when she is inside more. But overall she never realizes how bad it is. Spring is definitely worse with a change in phonation.   She did use Flonase quite a bit, but now if she uses it too much, it will just run out. She did start using some salt water rinses with continued runny nose. She estimates that she gets treated with antibiotics around once per year. Last year she was treated with round after round of antibiotics. She does not get antibiotics for any other reasons.  She does have poison ivy  occasionally. She thinks that she might have some eczema. But she uses the steroid ointment as needed.  Otherwise, there is no history of other atopic diseases, including asthma, food allergies, drug allergies, stinging insect allergies, eczema or urticaria. There is no significant infectious history. Vaccinations are up to date.    Past Medical History: Patient Active Problem List   Diagnosis Date Noted  . Facial spasm  08/02/2015  . Herniated lumbar intervertebral disc 09/01/2014    Medication List:  Allergies as of 08/12/2019   No Known Allergies     Medication List       Accurate as of August 12, 2019 10:13 PM. If you have any questions, ask your nurse or doctor.        STOP taking these medications   botulinum toxin Type A 100 units Solr injection Commonly known as: Botox Stopped by: Alfonse Spruce, MD     TAKE these medications   azelastine 0.1 % nasal spray Commonly known as: ASTELIN 2 sprays each nostril 1-2 times daily prn Started by: Alfonse Spruce, MD   CALCIUM PO Take by mouth.   cholecalciferol 1000 units tablet Commonly known as: VITAMIN D Take 1,000 Units by mouth daily.   fexofenadine 180 MG tablet Commonly known as: ALLEGRA Take 1 tablet (180 mg total) by mouth daily. Started by: Alfonse Spruce, MD   fluticasone 50 MCG/ACT nasal spray Commonly known as: FLONASE Place 2 sprays into both nostrils daily. Started by: Alfonse Spruce, MD   multivitamin tablet Take 1 tablet by mouth daily. What changed: Another medication with the same name was removed. Continue taking this medication, and follow the directions you see here. Changed by: Alfonse Spruce, MD   PROBIOTIC-10 PO Take by mouth.       Birth History: non-contributory  Developmental History: non-contributory  Past Surgical History: Past Surgical History:  Procedure Laterality Date  . ANTERIOR CRUCIATE LIGAMENT REPAIR Left 1998  . CESAREAN SECTION  1988  . LASIK    . LUMBAR LAMINECTOMY/DECOMPRESSION MICRODISCECTOMY Left 09/01/2014   Procedure: HEMI LAMINECTOMY MICRODISCECTOMY L5-S1 LEFT ;  Surgeon: Jacki Cones, MD;  Location: [facility];  Service: Orthopedics;  Laterality: Left;  . SINOSCOPY    . TONSILLECTOMY       Family History: Family History  Problem Relation Age of Onset  . Dementia Mother        lewy body  . Prostate cancer Father   . Hypertension  Father   . Diabetes Father   . Dementia Father   . Asthma Daughter   . Allergic rhinitis Neg Hx   . Angioedema Neg Hx   . Atopy Neg Hx   . Eczema Neg Hx   . Immunodeficiency Neg Hx   . Urticaria Neg Hx      Social History: Olivia Cole lives at home with her husband.  She lives in a house that is 4-year-old.  There is hardwood in the main living areas and carpeting in the bedrooms.  They have electric heating and central cooling.  There is a dog inside of the home.  There is no tobacco exposure.  She does not have dust mite covers on her bed, but she does on her pillows.  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  She and her husband also have a cabin.  Her husband is retired.   Review of Systems  Constitutional: Negative.  Negative for fever, malaise/fatigue and weight loss.  HENT: Positive for congestion and sinus pain. Negative for ear discharge and ear pain.        Positive for rhinorrhea. Positive for postnasal drip.  Eyes: Negative for pain, discharge and redness.  Respiratory: Negative for cough, sputum production, shortness of breath and wheezing.   Cardiovascular: Negative.  Negative for chest pain and palpitations.  Gastrointestinal: Negative for abdominal pain, constipation, diarrhea, heartburn, nausea and vomiting.  Skin: Negative.  Negative for itching and rash.  Neurological: Negative for dizziness and headaches.  Endo/Heme/Allergies: Negative for environmental allergies. Does not bruise/bleed easily.       Objective:   Blood pressure 130/70, pulse 68, temperature 97.8 F (36.6 C), temperature source Temporal, resp. rate 18, height 5' 6.5" (1.689 m), weight 167 lb 6.4 oz (75.9 kg), SpO2 97 %. Body mass index is 26.61 kg/m.   Physical Exam:   Physical Exam  Constitutional: She appears well-developed.  Pleasant female. Very talkative. Likes crossword puzzles.   HENT:  Head: Normocephalic and atraumatic.  Right Ear: Tympanic membrane,  external ear and ear canal normal. No drainage, swelling or tenderness. Tympanic membrane is not injected, not scarred, not erythematous, not retracted and not bulging.  Left Ear: Tympanic membrane, external ear and ear canal normal. No drainage, swelling or tenderness. Tympanic membrane is not injected, not scarred, not erythematous, not retracted and not bulging.  Nose: Mucosal edema and rhinorrhea present. No nasal deformity or septal deviation. No epistaxis. Right sinus exhibits no maxillary sinus tenderness and no frontal sinus tenderness. Left sinus exhibits no maxillary sinus tenderness and no frontal sinus tenderness.  Mouth/Throat: Uvula is midline and oropharynx is clear and moist. Mucous membranes are not pale and not dry.  Cobblestoning in the posterior oropharynx.   Eyes: Pupils are equal, round, and reactive to light. Conjunctivae and EOM are normal. Right eye exhibits no chemosis and no discharge. Left eye exhibits no chemosis and no discharge. Right conjunctiva is not injected. Left conjunctiva is not injected.  Cardiovascular: Normal rate, regular rhythm and normal heart sounds.  Respiratory: Effort normal and breath sounds normal. No accessory muscle usage. No tachypnea. No respiratory distress. She has no wheezes. She has no rhonchi. She has no rales. She exhibits no tenderness.  Moving air well in all lung fields.   GI: There is no abdominal tenderness. There is no rebound and no guarding.  Lymphadenopathy:       Head (right side): No submandibular, no tonsillar and no occipital adenopathy present.       Head (left side): No submandibular, no tonsillar and no occipital adenopathy present.    She has no cervical adenopathy.  Neurological: She is alert.  Skin: No abrasion, no petechiae and no rash noted. Rash is not papular, not vesicular and not urticarial. No erythema. No pallor.  Psychiatric: She has a normal mood and affect.     Diagnostic studies:    Allergy Studies:     Airborne Adult Perc - 08/12/19 0925    Time Antigen Placed  1610    Allergen Manufacturer  Waynette Buttery    Number of Test  59    Panel 1  Select    1. Control-Buffer 50% Glycerol  Negative    2. Control-Histamine 1 mg/ml  3+    3. Albumin saline  Negative    4. Bahia  Negative    5. [place]  3+    6. Johnson  3+    7. [place] Blue  3+    8. Meadow Fescue  3+    9. Perennial Rye  3+    10. Sweet Vernal  3+    11. Timothy  Negative    12. Cocklebur  Negative    13. Burweed Marshelder  Negative    14. Ragweed, short  Negative    15. Ragweed, Giant  Negative    16. Plantain,  English  2+    17. Lamb's Quarters  Negative    18. Sheep Sorrell  Negative    19. Rough Pigweed  2+    20. Marsh Elder, Rough  Negative    21. Mugwort, Common  3+    22. Ash mix  Negative    23. Birch mix  2+    24. Beech American  Negative    25. Box, Elder  Negative    26. Cedar, red  Negative    27. Cottonwood, [place]astern  Negative    28. Elm mix  Negative    29. Hickory mix  Negative    30. Maple mix  Negative    31. Oak, [place]astern mix  Negative    32. Pecan Pollen  3+    33. Pine mix  Negative    34. Sycamore Eastern  Negative    35. Walnut, Black Pollen  2+    36. Alternaria alternata  Negative    37. Cladosporium Herbarum  Negative    38. Aspergillus mix  Negative    39. Penicillium mix  Negative    40. Bipolaris sorokiniana (Helminthosporium)  Negative    41. Drechslera spicifera (Curvularia)  Negative    42. Mucor plumbeus  Negative    43. Fusarium moniliforme  Negative    44. Aureobasidium pullulans (pullulara)  Negative    45. Rhizopus oryzae  Negative    46. Botrytis cinera  Negative    47. Epicoccum nigrum  Negative    48. Phoma betae  Negative    49. Candida Albicans  Negative    50. Trichophyton mentagrophytes  Negative    51. Mite, D Farinae  5,000 AU/ml  Negative    52. Mite, D Pteronyssinus  5,000 AU/ml  Negative    53. Cat Hair 10,000 BAU/ml  Negative    54.  Dog Epithelia   Negative    55. Mixed Feathers  Negative    56. Horse Epithelia  Negative    57. Cockroach, German  Negative    58. Mouse  Negative    59. Tobacco Leaf  Negative     Intradermal - 08/12/19 1007    Allergen Manufacturer  Waynette ButteryGreer    Location  Back    Number of Test  10    Intradermal  Select    Control  Negative    Ragweed mix  2+    Mold 1  1+    Mold 2  4+    Mold 3  3+    Mold 4  4+    Cat  Negative    Dog  1+    Cockroach  2+    Mite mix  Negative       Allergy testing results were read and interpreted by myself, documented by clinical staff.         Malachi BondsJoel Chelsy Parrales, MD [facility]

## 2019-08-12 NOTE — Patient Instructions (Addendum)
1. Seasonal and perennial allergic rhinitis - Testing today showed: grasses, ragweed, weeds, trees, indoor molds, outdoor molds, dog and cockroach - Copy of test results provided.  - Avoidance measures provided. - Continue with: Flonase (fluticasone) two sprays per nostril daily - Start taking: Allegra (fexofenadine) 180mg  table once daily and Astelin (azelastine) 2 sprays per nostril 1-2 times daily as needed - You can use an extra dose of the antihistamine, if needed, for breakthrough symptoms.  - Consider nasal saline rinses 1-2 times daily to remove allergens from the nasal cavities as well as help with mucous clearance (this is especially helpful to do before the nasal sprays are given) - Consider allergy shots as a means of long-term control. - Allergy shots "re-train" and "reset" the immune system to ignore environmental allergens and decrease the resulting immune response to those allergens (sneezing, itchy watery eyes, runny nose, nasal congestion, etc).    - Allergy shots improve symptoms in 75-85% of patients.  - Call your insurance company and think about it and give [place] a call back.  2. Return in about 3 months (around 11/12/2019). This can be an in-person, a virtual Webex or a telephone follow up visit.   Please inform us of any Emergency Department visits, hospitalizations, or changes in symptoms. Call us before going to the ED for breathing or allergy symptoms since we might be able to fit you in for a sick visit. Feel free to contact us anytime with any questions, problems, or concerns.  It was a pleasure to meet you today!  Websites that have reliable patient information: 1. American Academy of Asthma, Allergy, and Immunology: www.aaaai.org 2. Food Allergy Research and Education (FARE): foodallergy.org 3. Mothers of Asthmatics: http://www.asthmacommunitynetwork.org 4. American College of Allergy, Asthma, and Immunology: www.acaai.org  "Like" [place] on Facebook and Instagram for  our latest updates!      Make sure you are registered to vote! If you have moved or changed any of your contact information, you will need to get this updated before voting!  In some cases, you MAY be able to register to vote online: [email or web address]    Voter ID laws are NOT going into effect for the General Election in November 2020! DO NOT let this stop you from exercising your right to vote!   Absentee voting is the SAFEST way to vote during the coronavirus pandemic!   Download and print an absentee ballot request form at [email or web address] or you can scan the QR code below with your smart phone:      More information on absentee ballots can be found here: [email or web address]   Reducing Pollen Exposure  The American Academy of Allergy, Asthma and Immunology suggests the following steps to reduce your exposure to pollen during allergy seasons.    1. Do not hang sheets or clothing out to dry; pollen may collect on these items. 2. Do not mow lawns or spend time around freshly cut grass; mowing stirs up pollen. 3. Keep windows closed at night.  Keep car windows closed while driving. 4. Minimize morning activities outdoors, a time when pollen counts are usually at their highest. 5. Stay indoors as much as possible when pollen counts or humidity is high and on windy days when pollen tends to remain in the air longer. 6. Use air conditioning when possible.  Many air conditioners have filters that trap the pollen spores. 7. Use a HEPA room air filter to remove pollen form the indoor air you breathe.  Control of Mold Allergen  Mold and fungi can grow on a variety of surfaces provided certain temperature and moisture conditions exist.  Outdoor molds grow on plants, decaying vegetation and soil.  The major outdoor mold, Alternaria and Cladosporium, are found in very high numbers during hot and dry conditions.  Generally, a late Summer  - Fall peak is seen for common outdoor fungal spores.  Rain will temporarily lower outdoor mold spore count, but counts rise rapidly when the rainy period ends.  The most important indoor molds are Aspergillus and Penicillium.  Dark, humid and poorly ventilated basements are ideal sites for mold growth.  The next most common sites of mold growth are the bathroom and the kitchen.  Outdoor (Seasonal) Mold Control  Positive outdoor molds via skin testing: Alternaria, Cladosporium, Bipolaris (Helminthsporium), Drechslera (Curvalaria) and Mucor  1. Use air conditioning and keep windows closed 2. Avoid exposure to decaying vegetation. 3. Avoid leaf raking. 4. Avoid grain handling. 5. Consider wearing a face mask if working in moldy areas.  6.   Indoor (Perennial) Mold Control   Positive indoor molds via skin testing: Aspergillus, Penicillium, Fusarium, Aureobasidium (Pullulara) and Rhizopus  1. Maintain humidity below 50%. 2. Clean washable surfaces with 5% bleach solution. 3. Remove sources e.g. contaminated carpets.     Control of Dog or Cat Allergen  Avoidance is the best way to manage a dog or cat allergy. If you have a dog or cat and are allergic to dog or cats, consider removing the dog or cat from the home. If you have a dog or cat but don't want to find it a new home, or if your family wants a pet even though someone in the household is allergic, here are some strategies that may help keep symptoms at bay:  1. Keep the pet out of your bedroom and restrict it to only a few rooms. Be advised that keeping the dog or cat in only one room will not limit the allergens to that room. 2. Don't pet, hug or kiss the dog or cat; if you do, wash your hands with soap and water. 3. High-efficiency particulate air (HEPA) cleaners run continuously in a bedroom or living room can reduce allergen levels over time. 4. Regular use of a high-efficiency vacuum cleaner or a central vacuum can reduce  allergen levels. 5. Giving your dog or cat a bath at least once a week can reduce airborne allergen.  Control of Cockroach Allergen  Cockroach allergen has been identified as an important cause of acute attacks of asthma, especially in urban settings.  There are fifty-five species of cockroach that exist in the [place], however only three, the [place], [place] species produce allergen that can affect patients with Asthma.  Allergens can be obtained from fecal particles, egg casings and secretions from cockroaches.    1. Remove food sources. 2. Reduce access to water. 3. Seal access and entry points. 4. Spray runways with 0.5-1% Diazinon or Chlorpyrifos 5. Blow boric acid power under stoves and refrigerator. 6. Place bait stations (hydramethylnon) at feeding sites.  Allergy Shots   Allergies are the result of a chain reaction that starts in the immune system. Your immune system controls how your body defends itself. For instance, if you have an allergy to pollen, your immune system identifies pollen as an invader or allergen. Your immune system overreacts by producing antibodies called Immunoglobulin E (IgE). These antibodies travel to cells that release chemicals, causing an allergic reaction.  The concept behind allergy  immunotherapy, whether it is received in the form of shots or tablets, is that the immune system can be desensitized to specific allergens that trigger allergy symptoms. Although it requires time and patience, the payback can be long-term relief.  How Do Allergy Shots Work?  Allergy shots work much like a vaccine. Your body responds to injected amounts of a particular allergen given in increasing doses, eventually developing a resistance and tolerance to it. Allergy shots can lead to decreased, minimal or no allergy symptoms.  There generally are two phases: build-up and maintenance. Build-up often ranges from three to six months and involves receiving  injections with increasing amounts of the allergens. The shots are typically given once or twice a week, though more rapid build-up schedules are sometimes used.  The maintenance phase begins when the most effective dose is reached. This dose is different for each person, depending on how allergic you are and your response to the build-up injections. Once the maintenance dose is reached, there are longer periods between injections, typically two to four weeks.  Occasionally doctors give cortisone-type shots that can temporarily reduce allergy symptoms. These types of shots are different and should not be confused with allergy immunotherapy shots.  Who Can Be Treated with Allergy Shots?  Allergy shots may be a good treatment approach for people with allergic rhinitis (hay fever), allergic asthma, conjunctivitis (eye allergy) or stinging insect allergy.   Before deciding to begin allergy shots, you should consider:  . The length of allergy season and the severity of your symptoms . Whether medications and/or changes to your environment can control your symptoms . Your desire to avoid long-term medication use . Time: allergy immunotherapy requires a major time commitment . Cost: may vary depending on your insurance coverage  Allergy shots for children age 6five and older are effective and often well tolerated. They might prevent the onset of new allergen sensitivities or the progression to asthma.  Allergy shots are not started on patients who are pregnant but can be continued on patients who become pregnant while receiving them. In some patients with other medical conditions or who take certain common medications, allergy shots may be of risk. It is important to mention other medications you talk to your allergist.   When Will I Feel Better?  Some may experience decreased allergy symptoms during the build-up phase. For others, it may take as long as 12 months on the maintenance dose. If there is  no improvement after a year of maintenance, your allergist will discuss other treatment options with you.  If you aren't responding to allergy shots, it may be because there is not enough dose of the allergen in your vaccine or there are missing allergens that were not identified during your allergy testing. Other reasons could be that there are high levels of the allergen in your environment or major exposure to non-allergic triggers like tobacco smoke.  What Is the Length of Treatment?  Once the maintenance dose is reached, allergy shots are generally continued for three to five years. The decision to stop should be discussed with your allergist at that time. Some people may experience a permanent reduction of allergy symptoms. Others may relapse and a longer course of allergy shots can be considered.  What Are the Possible Reactions?  The two types of adverse reactions that can occur with allergy shots are local and systemic. Common local reactions include very mild redness and swelling at the injection site, which can happen immediately or several  hours after. A systemic reaction, which is less common, affects the entire body or a particular body system. They are usually mild and typically respond quickly to medications. Signs include increased allergy symptoms such as sneezing, a stuffy nose or hives.  Rarely, a serious systemic reaction called anaphylaxis can develop. Symptoms include swelling in the throat, wheezing, a feeling of tightness in the chest, nausea or dizziness. Most serious systemic reactions develop within 30 minutes of allergy shots. This is why it is strongly recommended you wait in your doctor's office for 30 minutes after your injections. Your allergist is trained to watch for reactions, and his or her staff is trained and equipped with the proper medications to identify and treat them.  Who Should Administer Allergy Shots?  The preferred location for receiving shots is your  prescribing allergist's office. Injections can sometimes be given at another facility where the physician and staff are trained to recognize and treat reactions, and have received instructions by your prescribing allergist.

## 2019-08-24 ENCOUNTER — Telehealth: Payer: Self-pay

## 2019-08-24 ENCOUNTER — Other Ambulatory Visit: Payer: Self-pay

## 2019-08-24 MED ORDER — OLOPATADINE HCL 0.6 % NA SOLN: NASAL | 5 refills | Status: DC

## 2019-08-24 NOTE — Telephone Encounter (Signed)
Patient called and verbalized that when she was in the office on the 15th she was prescribed Azelastine to use 1-2 times daily as needed. She verbalized that she is using it in the evening but it is making her extremely groggy the next day. She is also prescribed and using Flonase once daily. She is requesting that the Azelastine be changed. Please advise.

## 2019-08-24 NOTE — Telephone Encounter (Signed)
Please inform patient that she can try generic Patanase with the same instructions as her Azelastine.

## 2019-08-24 NOTE — Telephone Encounter (Signed)
Patient made aware that prescription for Patanase was sent to her pharmacy and instructions given per Dr. Neldon Mc. Patient verbalized understanding.

## 2019-08-26 DIAGNOSIS — J301 Allergic rhinitis due to pollen: Secondary | ICD-10-CM | POA: Diagnosis not present

## 2019-08-26 NOTE — Progress Notes (Signed)
VIALS EXP 08-25-20 

## 2019-08-26 NOTE — Addendum Note (Signed)
Addended by: Valentina Shaggy on: 08/26/2019 01:00 PM   Modules accepted: Orders

## 2019-08-26 NOTE — Progress Notes (Signed)
We received notification from [facility] that she is interested in pursuing allergen immunotherapy. Prescriptions written and routed to the Immunotherapy Team.    Salvatore Marvel, MD  [facility] [place] [facility]

## 2019-08-30 ENCOUNTER — Other Ambulatory Visit: Payer: Self-pay

## 2019-08-30 DIAGNOSIS — J3089 Other allergic rhinitis: Secondary | ICD-10-CM | POA: Diagnosis not present

## 2019-08-30 MED ORDER — OLOPATADINE HCL 0.6 % NA SOLN: NASAL | 5 refills | Status: DC

## 2019-09-08 ENCOUNTER — Other Ambulatory Visit: Payer: Self-pay

## 2019-09-08 ENCOUNTER — Ambulatory Visit (INDEPENDENT_AMBULATORY_CARE_PROVIDER_SITE_OTHER): Payer: BC Managed Care – PPO | Admitting: *Deleted

## 2019-09-08 DIAGNOSIS — J309 Allergic rhinitis, unspecified: Secondary | ICD-10-CM

## 2019-09-08 MED ORDER — EPINEPHRINE 0.3 MG/0.3ML IJ SOAJ: 0.3000 mg | Freq: Once | INTRAMUSCULAR | 2 refills | Status: AC

## 2019-09-08 MED ORDER — OLOPATADINE HCL 0.6 % NA SOLN: NASAL | 5 refills | Status: DC

## 2019-09-08 NOTE — Progress Notes (Signed)
Immunotherapy   Patient Details  Name: Olivia Cole MRN: [id number] Date of Birth: [birth year]  09/08/2019  Weber Cooks started injections for  G-W-T-D, RW-MOLDS-CR Following schedule: B  Frequency:2 times per week Epi-Pen: Yes Consent signed and patient instructions given.u Patient received .49mL of G-W-T-D in the RUA and .10mL of RW-MOLDS-CR in the LUA. Patient waited 30 minutes and did not experience any issues.    Ashleigh Fernandez-Vernon 09/08/2019, 9:38 AM

## 2019-09-15 ENCOUNTER — Ambulatory Visit (INDEPENDENT_AMBULATORY_CARE_PROVIDER_SITE_OTHER): Payer: BC Managed Care – PPO

## 2019-09-15 DIAGNOSIS — J309 Allergic rhinitis, unspecified: Secondary | ICD-10-CM

## 2019-09-21 ENCOUNTER — Ambulatory Visit (INDEPENDENT_AMBULATORY_CARE_PROVIDER_SITE_OTHER): Payer: BC Managed Care – PPO

## 2019-09-21 DIAGNOSIS — J309 Allergic rhinitis, unspecified: Secondary | ICD-10-CM

## 2019-09-27 ENCOUNTER — Ambulatory Visit (INDEPENDENT_AMBULATORY_CARE_PROVIDER_SITE_OTHER): Payer: BC Managed Care – PPO | Admitting: *Deleted

## 2019-09-27 DIAGNOSIS — J309 Allergic rhinitis, unspecified: Secondary | ICD-10-CM

## 2019-09-30 ENCOUNTER — Ambulatory Visit (INDEPENDENT_AMBULATORY_CARE_PROVIDER_SITE_OTHER): Payer: BC Managed Care – PPO | Admitting: *Deleted

## 2019-09-30 DIAGNOSIS — J309 Allergic rhinitis, unspecified: Secondary | ICD-10-CM

## 2019-10-05 ENCOUNTER — Ambulatory Visit (INDEPENDENT_AMBULATORY_CARE_PROVIDER_SITE_OTHER): Payer: BC Managed Care – PPO

## 2019-10-05 DIAGNOSIS — J309 Allergic rhinitis, unspecified: Secondary | ICD-10-CM | POA: Diagnosis not present

## 2019-10-08 ENCOUNTER — Ambulatory Visit (INDEPENDENT_AMBULATORY_CARE_PROVIDER_SITE_OTHER): Payer: BC Managed Care – PPO | Admitting: *Deleted

## 2019-10-08 DIAGNOSIS — J309 Allergic rhinitis, unspecified: Secondary | ICD-10-CM | POA: Diagnosis not present

## 2019-10-12 ENCOUNTER — Ambulatory Visit (INDEPENDENT_AMBULATORY_CARE_PROVIDER_SITE_OTHER): Payer: BC Managed Care – PPO

## 2019-10-12 DIAGNOSIS — J309 Allergic rhinitis, unspecified: Secondary | ICD-10-CM

## 2019-10-15 ENCOUNTER — Ambulatory Visit (INDEPENDENT_AMBULATORY_CARE_PROVIDER_SITE_OTHER): Payer: BC Managed Care – PPO | Admitting: *Deleted

## 2019-10-15 DIAGNOSIS — J309 Allergic rhinitis, unspecified: Secondary | ICD-10-CM

## 2019-10-18 ENCOUNTER — Ambulatory Visit (INDEPENDENT_AMBULATORY_CARE_PROVIDER_SITE_OTHER): Payer: BC Managed Care – PPO

## 2019-10-18 DIAGNOSIS — J309 Allergic rhinitis, unspecified: Secondary | ICD-10-CM | POA: Diagnosis not present

## 2019-10-20 ENCOUNTER — Ambulatory Visit (INDEPENDENT_AMBULATORY_CARE_PROVIDER_SITE_OTHER): Payer: BC Managed Care – PPO | Admitting: *Deleted

## 2019-10-20 DIAGNOSIS — J309 Allergic rhinitis, unspecified: Secondary | ICD-10-CM

## 2019-10-25 ENCOUNTER — Ambulatory Visit (INDEPENDENT_AMBULATORY_CARE_PROVIDER_SITE_OTHER): Payer: BC Managed Care – PPO

## 2019-10-25 DIAGNOSIS — J309 Allergic rhinitis, unspecified: Secondary | ICD-10-CM | POA: Diagnosis not present

## 2019-10-27 ENCOUNTER — Ambulatory Visit (INDEPENDENT_AMBULATORY_CARE_PROVIDER_SITE_OTHER): Payer: BC Managed Care – PPO | Admitting: *Deleted

## 2019-10-27 DIAGNOSIS — J309 Allergic rhinitis, unspecified: Secondary | ICD-10-CM

## 2019-11-01 ENCOUNTER — Ambulatory Visit (INDEPENDENT_AMBULATORY_CARE_PROVIDER_SITE_OTHER): Payer: BC Managed Care – PPO

## 2019-11-01 DIAGNOSIS — J309 Allergic rhinitis, unspecified: Secondary | ICD-10-CM

## 2019-11-04 ENCOUNTER — Ambulatory Visit (INDEPENDENT_AMBULATORY_CARE_PROVIDER_SITE_OTHER): Payer: BC Managed Care – PPO | Admitting: *Deleted

## 2019-11-04 DIAGNOSIS — J309 Allergic rhinitis, unspecified: Secondary | ICD-10-CM | POA: Diagnosis not present

## 2019-11-08 ENCOUNTER — Ambulatory Visit (INDEPENDENT_AMBULATORY_CARE_PROVIDER_SITE_OTHER): Payer: BC Managed Care – PPO

## 2019-11-08 DIAGNOSIS — J309 Allergic rhinitis, unspecified: Secondary | ICD-10-CM | POA: Diagnosis not present

## 2019-11-11 ENCOUNTER — Ambulatory Visit (INDEPENDENT_AMBULATORY_CARE_PROVIDER_SITE_OTHER): Payer: BC Managed Care – PPO

## 2019-11-11 DIAGNOSIS — J309 Allergic rhinitis, unspecified: Secondary | ICD-10-CM | POA: Diagnosis not present

## 2019-11-16 ENCOUNTER — Encounter: Payer: Self-pay | Admitting: Allergy & Immunology

## 2019-11-16 ENCOUNTER — Ambulatory Visit: Payer: BC Managed Care – PPO | Admitting: Allergy & Immunology

## 2019-11-16 ENCOUNTER — Other Ambulatory Visit: Payer: Self-pay

## 2019-11-16 VITALS — BP 114/76 | HR 62 | Temp 97.3°F | Resp 18

## 2019-11-16 DIAGNOSIS — J3089 Other allergic rhinitis: Secondary | ICD-10-CM | POA: Diagnosis not present

## 2019-11-16 DIAGNOSIS — J309 Allergic rhinitis, unspecified: Secondary | ICD-10-CM

## 2019-11-16 DIAGNOSIS — J302 Other seasonal allergic rhinitis: Secondary | ICD-10-CM | POA: Diagnosis not present

## 2019-11-16 MED ORDER — XHANCE 93 MCG/ACT NA EXHU: 2.0000 | INHALANT_SUSPENSION | Freq: Two times a day (BID) | NASAL | 5 refills | Status: DC

## 2019-11-16 MED ORDER — TRIAMCINOLONE ACETONIDE 0.1 % EX OINT: TOPICAL_OINTMENT | CUTANEOUS | 1 refills | Status: DC

## 2019-11-16 NOTE — Progress Notes (Signed)
FOLLOW UP  Date of Service/Encounter:  11/16/19   Assessment:   Seasonal and perennial allergic rhinitis (grasses, ragweed, weeds, trees, indoor molds, outdoor molds, dog and cockroach)  Large local reactions  Plan/Recommendations:   1. Seasonal and perennial allergic rhinitis (grasses, ragweed, weeds, trees, indoor molds, outdoor molds, dog and cockroach) - We are going to drop your pollen vial back to the beginning of Green Vial (0.05 mL) and increase weekly only. - Add on triamcinolone ointment applied to the injection site after each shot.  - Stop the current nose sprays and start Xhance one spray per nostril up to twice daily. - Continue with: Zyrtec (cetirizine) 10mg  as needed (but definitely on shot days) - We will see how the spring goes with the allergy shots on board.  2. Return in about 1 year (around 11/15/2020) or earlier if needed. This can be an in-person, a virtual Webex or a telephone follow up visit.   Subjective:   Olivia Cole is a 64 y.o. female presenting today for follow up of  Chief Complaint  Patient presents with  . Follow-up    Olivia Cole has a history of the following: Patient Active Problem List   Diagnosis Date Noted  . Facial spasm 08/02/2015  . Herniated lumbar intervertebral disc 09/01/2014    History obtained from: chart review and patient.  Olivia Cole is a 64 y.o. female presenting for a follow up visit.  She was last seen in October 2020 as a new patient.  At that time, her testing showed sensitizations to grasses, ragweed, weeds, trees, indoor and outdoor molds, dog, and cockroach.  We continued with Flonase 2 sprays per nostril daily.  We added on Allegra and Astelin.  We did discuss allergen immunotherapy and she did decide to start allergy shots.   Since the last visit, she has done well. She does have some delayed reactions to her allergy shots. This has only happened once and she seems very concerned with it today. It clearly  got her attention and has her concerned. She did have swelling on his right arm that extended past her elbow. She did not have any systemic reactions including wheezing, throat swelling, or other concerns.   Yena is on allergen immunotherapy. She receives two injections. Immunotherapy script #1 contains trees, weeds, grasses and dog. She currently receives 0.67mL of the GREEN vial (1/1,000). Immunotherapy script #2 contains ragweed, molds and cockroach. She currently receives 0.41mL of the GREEN vial (1/1,000). She started shots November of 2020 and not yet reached maintenance.   She is going to be starting classes again soon.  They are all virtual right now.  She is planning to get the COVID-19 vaccination whenever it is available to her.  Otherwise, there have been no changes to her past medical history, surgical history, family history, or social history.    Review of Systems  Constitutional: Negative.  Negative for chills, fever, malaise/fatigue and weight loss.  HENT: Negative.  Negative for congestion, ear discharge and ear pain.   Eyes: Negative for pain, discharge and redness.  Respiratory: Negative for cough, sputum production, shortness of breath and wheezing.   Cardiovascular: Negative.  Negative for chest pain and palpitations.  Gastrointestinal: Negative for abdominal pain, constipation, diarrhea, heartburn, nausea and vomiting.  Skin: Negative.  Negative for itching and rash.  Neurological: Negative for dizziness and headaches.  Endo/Heme/Allergies: Negative for environmental allergies. Does not bruise/bleed easily.       Objective:   Blood pressure 114/76, pulse  62, temperature (!) 97.3 F (36.3 C), temperature source Temporal, resp. rate 18, SpO2 99 %. There is no height or weight on file to calculate BMI.   Physical Exam:  Physical Exam  Constitutional: She appears well-developed.  Pleasant talkative female.  HENT:  Head: Normocephalic and atraumatic.  Right  Ear: Tympanic membrane, external ear and ear canal normal.  Left Ear: Tympanic membrane, external ear and ear canal normal.  Nose: Mucosal edema and rhinorrhea present. No nasal deformity or septal deviation. No epistaxis. Right sinus exhibits no maxillary sinus tenderness and no frontal sinus tenderness. Left sinus exhibits no maxillary sinus tenderness and no frontal sinus tenderness.  Mouth/Throat: Uvula is midline and oropharynx is clear and moist. Mucous membranes are not pale and not dry.  Tonsils normal bilaterally.  Eyes: Pupils are equal, round, and reactive to light. Conjunctivae and EOM are normal. Right eye exhibits no chemosis and no discharge. Left eye exhibits no chemosis and no discharge. Right conjunctiva is not injected. Left conjunctiva is not injected.  Cardiovascular: Normal rate, regular rhythm and normal heart sounds.  Respiratory: Effort normal and breath sounds normal. No accessory muscle usage. No tachypnea. No respiratory distress. She has no wheezes. She has no rhonchi. She has no rales. She exhibits no tenderness.  No increased work of breathing.  Lymphadenopathy:    She has no cervical adenopathy.  Neurological: She is alert.  Skin: No abrasion, no petechiae and no rash noted. Rash is not papular, not vesicular and not urticarial. No erythema. No pallor.  No eczematous or urticarial lesions noted.  Psychiatric: She has a normal mood and affect.     Diagnostic studies: none    Salvatore Marvel, MD  [facility] [place] [place]

## 2019-11-16 NOTE — Patient Instructions (Addendum)
1. Seasonal and perennial allergic rhinitis (grasses, ragweed, weeds, trees, indoor molds, outdoor molds, dog and cockroach) - We are going to drop your pollen vial back to the beginning of Green Vial (0.05 mL) and increase weekly only. - Add on triamcinolone ointment applied to the injection site after each shot.  - Stop the current nose sprays and start Xhance one spray per nostril up to twice daily. - Continue with: Zyrtec (cetirizine) 10mg  as needed (but definitely on shot days) - We will see how the spring goes with the allergy shots on board.  2. Return in about 1 year (around 11/15/2020) or earlier if needed. This can be an in-person, a virtual Webex or a telephone follow up visit.   Please inform 11/17/2020 of any Emergency Department visits, hospitalizations, or changes in symptoms. Call us before going to the ED for breathing or allergy symptoms since we might be able to fit you in for a sick visit. Feel free to contact us anytime with any questions, problems, or concerns.  It was a pleasure to see you again today!  Websites that have reliable patient information: 1. American Academy of Asthma, Allergy, and Immunology: www.aaaai.org 2. Food Allergy Research and Education (FARE): foodallergy.org 3. Mothers of Asthmatics: http://www.asthmacommunitynetwork.org 4. American College of Allergy, Asthma, and Immunology: www.acaai.org   COVID-19 Vaccine Information can be found at: [place] For questions related to vaccine distribution or appointments, please email [email or web address] or call [phone].     "Like" [phone] on Facebook and Instagram for our latest updates!        Make sure you are registered to vote! If you have moved or changed any of your contact information, you will need to get this updated before voting!  In some cases, you MAY be able to register to vote online:  [place]

## 2019-11-24 ENCOUNTER — Ambulatory Visit (INDEPENDENT_AMBULATORY_CARE_PROVIDER_SITE_OTHER): Payer: BC Managed Care – PPO | Admitting: *Deleted

## 2019-11-24 DIAGNOSIS — J309 Allergic rhinitis, unspecified: Secondary | ICD-10-CM

## 2019-11-30 ENCOUNTER — Ambulatory Visit (INDEPENDENT_AMBULATORY_CARE_PROVIDER_SITE_OTHER): Payer: BC Managed Care – PPO

## 2019-11-30 DIAGNOSIS — J309 Allergic rhinitis, unspecified: Secondary | ICD-10-CM | POA: Diagnosis not present

## 2019-12-07 ENCOUNTER — Ambulatory Visit (INDEPENDENT_AMBULATORY_CARE_PROVIDER_SITE_OTHER): Payer: BC Managed Care – PPO

## 2019-12-07 DIAGNOSIS — J309 Allergic rhinitis, unspecified: Secondary | ICD-10-CM

## 2019-12-13 ENCOUNTER — Ambulatory Visit (INDEPENDENT_AMBULATORY_CARE_PROVIDER_SITE_OTHER): Payer: BC Managed Care – PPO

## 2019-12-13 DIAGNOSIS — J309 Allergic rhinitis, unspecified: Secondary | ICD-10-CM

## 2019-12-21 ENCOUNTER — Ambulatory Visit (INDEPENDENT_AMBULATORY_CARE_PROVIDER_SITE_OTHER): Payer: BC Managed Care – PPO

## 2019-12-21 DIAGNOSIS — J309 Allergic rhinitis, unspecified: Secondary | ICD-10-CM

## 2019-12-29 ENCOUNTER — Ambulatory Visit (INDEPENDENT_AMBULATORY_CARE_PROVIDER_SITE_OTHER): Payer: BC Managed Care – PPO

## 2019-12-29 DIAGNOSIS — J309 Allergic rhinitis, unspecified: Secondary | ICD-10-CM | POA: Diagnosis not present

## 2020-01-02 ENCOUNTER — Ambulatory Visit: Payer: BC Managed Care – PPO | Attending: Internal Medicine

## 2020-01-02 DIAGNOSIS — Z23 Encounter for immunization: Secondary | ICD-10-CM | POA: Insufficient documentation

## 2020-01-02 NOTE — Progress Notes (Signed)
   Covid-19 Vaccination Clinic  Name:  Olivia Cole    MRN: [id number] DOB: Feb 08, 1956  01/02/2020  Olivia Cole was observed post Covid-19 immunization for 15 minutes without incident. She was provided with Vaccine Information Sheet and instruction to access the V-Safe system.   Olivia Cole was instructed to call 911 with any severe reactions post vaccine: Marland Kitchen Difficulty breathing  . Swelling of face and throat  . A fast heartbeat  . A bad rash all over body  . Dizziness and weakness   Immunizations Administered    Name Date Dose VIS Date Route   Pfizer COVID-19 Vaccine 01/02/2020  3:21 PM 0.3 mL 10/08/2019 Intramuscular   Manufacturer: [employer], [employer]   Lot: [id number]   NDC: [address]-0

## 2020-01-05 ENCOUNTER — Ambulatory Visit (INDEPENDENT_AMBULATORY_CARE_PROVIDER_SITE_OTHER): Payer: BC Managed Care – PPO

## 2020-01-05 DIAGNOSIS — J309 Allergic rhinitis, unspecified: Secondary | ICD-10-CM | POA: Diagnosis not present

## 2020-01-11 ENCOUNTER — Ambulatory Visit (INDEPENDENT_AMBULATORY_CARE_PROVIDER_SITE_OTHER): Payer: BC Managed Care – PPO

## 2020-01-11 DIAGNOSIS — J309 Allergic rhinitis, unspecified: Secondary | ICD-10-CM

## 2020-01-17 ENCOUNTER — Ambulatory Visit (INDEPENDENT_AMBULATORY_CARE_PROVIDER_SITE_OTHER): Payer: BC Managed Care – PPO

## 2020-01-17 DIAGNOSIS — J309 Allergic rhinitis, unspecified: Secondary | ICD-10-CM | POA: Diagnosis not present

## 2020-01-26 ENCOUNTER — Ambulatory Visit (INDEPENDENT_AMBULATORY_CARE_PROVIDER_SITE_OTHER): Payer: BC Managed Care – PPO

## 2020-01-26 DIAGNOSIS — J309 Allergic rhinitis, unspecified: Secondary | ICD-10-CM | POA: Diagnosis not present

## 2020-02-01 ENCOUNTER — Ambulatory Visit: Payer: BC Managed Care – PPO

## 2020-02-03 ENCOUNTER — Ambulatory Visit (INDEPENDENT_AMBULATORY_CARE_PROVIDER_SITE_OTHER): Payer: BC Managed Care – PPO

## 2020-02-03 DIAGNOSIS — J309 Allergic rhinitis, unspecified: Secondary | ICD-10-CM

## 2020-02-07 ENCOUNTER — Ambulatory Visit (INDEPENDENT_AMBULATORY_CARE_PROVIDER_SITE_OTHER): Payer: BC Managed Care – PPO

## 2020-02-07 DIAGNOSIS — J309 Allergic rhinitis, unspecified: Secondary | ICD-10-CM | POA: Diagnosis not present

## 2020-02-15 ENCOUNTER — Ambulatory Visit (INDEPENDENT_AMBULATORY_CARE_PROVIDER_SITE_OTHER): Payer: BC Managed Care – PPO

## 2020-02-15 DIAGNOSIS — J309 Allergic rhinitis, unspecified: Secondary | ICD-10-CM

## 2020-02-17 DIAGNOSIS — J301 Allergic rhinitis due to pollen: Secondary | ICD-10-CM

## 2020-02-17 NOTE — Progress Notes (Signed)
VIALS EXP 02-16-21 

## 2020-02-22 ENCOUNTER — Ambulatory Visit (INDEPENDENT_AMBULATORY_CARE_PROVIDER_SITE_OTHER): Payer: BC Managed Care – PPO

## 2020-02-22 DIAGNOSIS — J309 Allergic rhinitis, unspecified: Secondary | ICD-10-CM | POA: Diagnosis not present

## 2020-02-28 ENCOUNTER — Ambulatory Visit (INDEPENDENT_AMBULATORY_CARE_PROVIDER_SITE_OTHER): Payer: BC Managed Care – PPO

## 2020-02-28 DIAGNOSIS — J309 Allergic rhinitis, unspecified: Secondary | ICD-10-CM

## 2020-03-07 ENCOUNTER — Ambulatory Visit (INDEPENDENT_AMBULATORY_CARE_PROVIDER_SITE_OTHER): Payer: BC Managed Care – PPO

## 2020-03-07 DIAGNOSIS — J309 Allergic rhinitis, unspecified: Secondary | ICD-10-CM

## 2020-03-08 ENCOUNTER — Other Ambulatory Visit: Payer: Self-pay | Admitting: Obstetrics and Gynecology

## 2020-03-08 DIAGNOSIS — E2839 Other primary ovarian failure: Secondary | ICD-10-CM

## 2020-03-13 ENCOUNTER — Ambulatory Visit (INDEPENDENT_AMBULATORY_CARE_PROVIDER_SITE_OTHER): Payer: BC Managed Care – PPO

## 2020-03-13 DIAGNOSIS — J309 Allergic rhinitis, unspecified: Secondary | ICD-10-CM | POA: Diagnosis not present

## 2020-03-20 ENCOUNTER — Ambulatory Visit (INDEPENDENT_AMBULATORY_CARE_PROVIDER_SITE_OTHER): Payer: BC Managed Care – PPO

## 2020-03-20 DIAGNOSIS — J309 Allergic rhinitis, unspecified: Secondary | ICD-10-CM

## 2020-03-29 ENCOUNTER — Ambulatory Visit (INDEPENDENT_AMBULATORY_CARE_PROVIDER_SITE_OTHER): Payer: BC Managed Care – PPO

## 2020-03-29 DIAGNOSIS — J309 Allergic rhinitis, unspecified: Secondary | ICD-10-CM | POA: Diagnosis not present

## 2020-04-03 ENCOUNTER — Ambulatory Visit (INDEPENDENT_AMBULATORY_CARE_PROVIDER_SITE_OTHER): Payer: BC Managed Care – PPO

## 2020-04-03 DIAGNOSIS — J309 Allergic rhinitis, unspecified: Secondary | ICD-10-CM

## 2020-04-04 ENCOUNTER — Telehealth: Payer: Self-pay | Admitting: Neurology

## 2020-04-04 ENCOUNTER — Encounter: Payer: Self-pay | Admitting: Neurology

## 2020-04-04 NOTE — Telephone Encounter (Signed)
Please advise,

## 2020-04-04 NOTE — Telephone Encounter (Signed)
Okay from my standpoint once authorized

## 2020-04-04 NOTE — Telephone Encounter (Signed)
Please see

## 2020-04-04 NOTE — Telephone Encounter (Signed)
Patient called in wanting to start back doing her Botox treatments. Hasn't had one since 10/05/18. I started Benefit Verification for patient on Botox One. Does patient need an office visit before Botox since its been so long? Thanks!

## 2020-04-04 NOTE — Progress Notes (Addendum)
Patient called in wanting to start doing Botox again. I submitted BV on Botox One.   BV stated patient needs PA. Submitted PA thru [facility]. Patient is Research officer, political party or spec pharm.   Lien Faust KeyElsie Ra - PA Case ID: [id number] Need help? Call us at [phone] Outcome Approvedtoday Your PA request has been approved. Additional information will be provided in the approval communication. (Message 1145) Drug Botox 100UNIT solution Form [employer] Electronic PA Form [phone] NCPDP)  Approval is for 04/06/20-04/06/21.

## 2020-04-04 NOTE — Telephone Encounter (Signed)
Just let me know when it is approved and I will sch

## 2020-04-13 ENCOUNTER — Ambulatory Visit (INDEPENDENT_AMBULATORY_CARE_PROVIDER_SITE_OTHER): Payer: BC Managed Care – PPO

## 2020-04-13 DIAGNOSIS — J309 Allergic rhinitis, unspecified: Secondary | ICD-10-CM

## 2020-04-19 ENCOUNTER — Ambulatory Visit (INDEPENDENT_AMBULATORY_CARE_PROVIDER_SITE_OTHER): Payer: BC Managed Care – PPO

## 2020-04-19 DIAGNOSIS — J309 Allergic rhinitis, unspecified: Secondary | ICD-10-CM | POA: Diagnosis not present

## 2020-04-24 ENCOUNTER — Ambulatory Visit (INDEPENDENT_AMBULATORY_CARE_PROVIDER_SITE_OTHER): Payer: BC Managed Care – PPO

## 2020-04-24 DIAGNOSIS — J309 Allergic rhinitis, unspecified: Secondary | ICD-10-CM | POA: Diagnosis not present

## 2020-04-26 ENCOUNTER — Other Ambulatory Visit: Payer: Self-pay

## 2020-04-26 ENCOUNTER — Ambulatory Visit (INDEPENDENT_AMBULATORY_CARE_PROVIDER_SITE_OTHER): Payer: BC Managed Care – PPO | Admitting: Neurology

## 2020-04-26 DIAGNOSIS — G5132 Clonic hemifacial spasm, left: Secondary | ICD-10-CM

## 2020-04-26 MED ORDER — ONABOTULINUMTOXINA 100 UNITS IJ SOLR
15.0000 [IU] | Freq: Once | INTRAMUSCULAR | Status: AC
  Administered 2020-04-26: 15 [IU] via INTRAMUSCULAR

## 2020-04-26 NOTE — Procedures (Signed)
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  Clinically, pt with significant spasm induced by closure of eye on L   Consent obtained from: The patient Benefits discussed included, but were not limited to decreased muscle tightness, increased joint range of motion, and decreased pain.  Risk discussed included, but were not limited pain and discomfort, bleeding, bruising, excessive weakness, venous thrombosis, muscle atrophy and dysphagia.  A copy of the patient medication guide was given to the patient which explains the blackbox warning.  Patients identity and treatment sites confirmed Yes.  .  Details of Procedure: Skin was cleaned with alcohol.   Prior to injection, the needle plunger was aspirated to make sure the needle was not within a blood vessel.  There was no blood retrieved on aspiration.    Following is a summary of the muscles injected  And the amount of Botulinum toxin used:  Injections  Location Left  Right Units Number of sites        Corrugator      Frontalis      Lower Lid, Lateral 2.5  2.5 1  Lower Lid Medial 2.5  2.5 1  Upper Lid, Lateral 2.5  2.5 1  Upper Lid, Medial 2.5  2.5 1  Canthus 2.5 2.5 5.0 1 each side  Temporalis      Masseter      Procerus      Zygomaticus Major      TOTAL UNITS:   15.0    Agent: Botulinum Type A ( Onobotulinum Toxin type A ).  1 vials of Botox were used, each containing 50 units and freshly diluted with 2 mL of sterile, non-perserved saline   Total injected (Units): 15.0  Total wasted (Units):85 Pt tolerated procedure well without complications.   Reinjection is anticipated in 3 months.

## 2020-04-27 ENCOUNTER — Ambulatory Visit: Payer: BC Managed Care – PPO | Admitting: Neurology

## 2020-05-02 ENCOUNTER — Ambulatory Visit (INDEPENDENT_AMBULATORY_CARE_PROVIDER_SITE_OTHER): Payer: BC Managed Care – PPO

## 2020-05-02 DIAGNOSIS — J309 Allergic rhinitis, unspecified: Secondary | ICD-10-CM

## 2020-05-08 ENCOUNTER — Ambulatory Visit (INDEPENDENT_AMBULATORY_CARE_PROVIDER_SITE_OTHER): Payer: BC Managed Care – PPO

## 2020-05-08 DIAGNOSIS — J309 Allergic rhinitis, unspecified: Secondary | ICD-10-CM | POA: Diagnosis not present

## 2020-05-10 DIAGNOSIS — J3081 Allergic rhinitis due to animal (cat) (dog) hair and dander: Secondary | ICD-10-CM

## 2020-05-10 NOTE — Progress Notes (Signed)
EXP 05/10/21 °

## 2020-05-15 ENCOUNTER — Ambulatory Visit (INDEPENDENT_AMBULATORY_CARE_PROVIDER_SITE_OTHER): Payer: BC Managed Care – PPO

## 2020-05-15 DIAGNOSIS — J309 Allergic rhinitis, unspecified: Secondary | ICD-10-CM | POA: Diagnosis not present

## 2020-05-16 ENCOUNTER — Other Ambulatory Visit: Payer: BC Managed Care – PPO

## 2020-05-23 ENCOUNTER — Ambulatory Visit (INDEPENDENT_AMBULATORY_CARE_PROVIDER_SITE_OTHER): Payer: BC Managed Care – PPO

## 2020-05-23 DIAGNOSIS — J309 Allergic rhinitis, unspecified: Secondary | ICD-10-CM

## 2020-05-24 ENCOUNTER — Ambulatory Visit: Payer: BC Managed Care – PPO | Attending: Internal Medicine

## 2020-05-24 DIAGNOSIS — Z20822 Contact with and (suspected) exposure to covid-19: Secondary | ICD-10-CM

## 2020-05-25 LAB — NOVEL CORONAVIRUS, NAA: SARS-CoV-2, NAA: NOT DETECTED

## 2020-05-25 LAB — SARS-COV-2, NAA 2 DAY TAT

## 2020-06-02 ENCOUNTER — Ambulatory Visit (INDEPENDENT_AMBULATORY_CARE_PROVIDER_SITE_OTHER): Payer: BC Managed Care – PPO

## 2020-06-02 DIAGNOSIS — J309 Allergic rhinitis, unspecified: Secondary | ICD-10-CM | POA: Diagnosis not present

## 2020-06-08 ENCOUNTER — Ambulatory Visit (INDEPENDENT_AMBULATORY_CARE_PROVIDER_SITE_OTHER): Payer: BC Managed Care – PPO

## 2020-06-08 DIAGNOSIS — J309 Allergic rhinitis, unspecified: Secondary | ICD-10-CM

## 2020-06-13 ENCOUNTER — Ambulatory Visit
Admission: RE | Admit: 2020-06-13 | Discharge: 2020-06-13 | Disposition: A | Discharge status: Home / Self Care | Payer: BC Managed Care – PPO | Source: Ambulatory Visit | Attending: Obstetrics and Gynecology | Admitting: Obstetrics and Gynecology

## 2020-06-13 ENCOUNTER — Other Ambulatory Visit: Payer: Self-pay

## 2020-06-13 DIAGNOSIS — E2839 Other primary ovarian failure: Secondary | ICD-10-CM

## 2020-06-14 ENCOUNTER — Ambulatory Visit (INDEPENDENT_AMBULATORY_CARE_PROVIDER_SITE_OTHER): Payer: BC Managed Care – PPO

## 2020-06-14 DIAGNOSIS — J309 Allergic rhinitis, unspecified: Secondary | ICD-10-CM | POA: Diagnosis not present

## 2020-06-19 ENCOUNTER — Ambulatory Visit (INDEPENDENT_AMBULATORY_CARE_PROVIDER_SITE_OTHER): Payer: BC Managed Care – PPO

## 2020-06-19 DIAGNOSIS — J309 Allergic rhinitis, unspecified: Secondary | ICD-10-CM

## 2020-06-26 ENCOUNTER — Ambulatory Visit (INDEPENDENT_AMBULATORY_CARE_PROVIDER_SITE_OTHER): Payer: BC Managed Care – PPO

## 2020-06-26 DIAGNOSIS — J309 Allergic rhinitis, unspecified: Secondary | ICD-10-CM | POA: Diagnosis not present

## 2020-07-04 ENCOUNTER — Ambulatory Visit (INDEPENDENT_AMBULATORY_CARE_PROVIDER_SITE_OTHER): Payer: BC Managed Care – PPO

## 2020-07-04 DIAGNOSIS — J309 Allergic rhinitis, unspecified: Secondary | ICD-10-CM

## 2020-07-17 ENCOUNTER — Ambulatory Visit (INDEPENDENT_AMBULATORY_CARE_PROVIDER_SITE_OTHER): Payer: BC Managed Care – PPO

## 2020-07-17 DIAGNOSIS — J309 Allergic rhinitis, unspecified: Secondary | ICD-10-CM

## 2020-07-24 ENCOUNTER — Ambulatory Visit (INDEPENDENT_AMBULATORY_CARE_PROVIDER_SITE_OTHER): Payer: BC Managed Care – PPO | Admitting: *Deleted

## 2020-07-24 DIAGNOSIS — J309 Allergic rhinitis, unspecified: Secondary | ICD-10-CM

## 2020-07-28 ENCOUNTER — Other Ambulatory Visit: Payer: Self-pay

## 2020-07-28 ENCOUNTER — Ambulatory Visit (INDEPENDENT_AMBULATORY_CARE_PROVIDER_SITE_OTHER): Payer: BC Managed Care – PPO | Admitting: Neurology

## 2020-07-28 DIAGNOSIS — G5132 Clonic hemifacial spasm, left: Secondary | ICD-10-CM

## 2020-07-28 MED ORDER — ONABOTULINUMTOXINA 100 UNITS IJ SOLR
25.0000 [IU] | Freq: Once | INTRAMUSCULAR | Status: AC
  Administered 2020-07-28: 25 [IU] via INTRAMUSCULAR

## 2020-07-28 NOTE — Procedures (Signed)
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  Clinically, pt with significant spasm induced by closure of eye on L   Consent obtained from: The patient Benefits discussed included, but were not limited to decreased muscle tightness, increased joint range of motion, and decreased pain.  Risk discussed included, but were not limited pain and discomfort, bleeding, bruising, excessive weakness, venous thrombosis, muscle atrophy and dysphagia.  A copy of the patient medication guide was given to the patient which explains the blackbox warning.  Patients identity and treatment sites confirmed Yes.  .  Details of Procedure: Skin was cleaned with alcohol.   Prior to injection, the needle plunger was aspirated to make sure the needle was not within a blood vessel.  There was no blood retrieved on aspiration.    Following is a summary of the muscles injected  And the amount of Botulinum toxin used:  Injections  Location Left  Right Units Number of sites        Corrugator      Frontalis      Lower Lid, Lateral 2.5  2.5 1  Lower Lid Medial 2.5  2.5 1  Upper Lid, Lateral 2.5  2.5 1  Upper Lid, Medial 2.5  2.5 1  Canthus 2.5 2.5 5.0 1 each side  Temporalis      Masseter      Procerus      Zygomaticus Major      TOTAL UNITS:   15.0    Agent: Botulinum Type A ( Onobotulinum Toxin type A ).  1 vials of Botox were used, each containing 50 units and freshly diluted with 2 mL of sterile, non-perserved saline   Total injected (Units): 15.0  Total wasted (Units):10 Pt tolerated procedure well without complications.   Reinjection is anticipated in 3 months.

## 2020-07-31 ENCOUNTER — Ambulatory Visit (INDEPENDENT_AMBULATORY_CARE_PROVIDER_SITE_OTHER): Payer: BC Managed Care – PPO

## 2020-07-31 DIAGNOSIS — J309 Allergic rhinitis, unspecified: Secondary | ICD-10-CM

## 2020-08-09 ENCOUNTER — Ambulatory Visit (INDEPENDENT_AMBULATORY_CARE_PROVIDER_SITE_OTHER): Payer: BC Managed Care – PPO | Admitting: *Deleted

## 2020-08-09 DIAGNOSIS — J309 Allergic rhinitis, unspecified: Secondary | ICD-10-CM | POA: Diagnosis not present

## 2020-08-14 ENCOUNTER — Ambulatory Visit (INDEPENDENT_AMBULATORY_CARE_PROVIDER_SITE_OTHER): Payer: BC Managed Care – PPO

## 2020-08-14 DIAGNOSIS — J309 Allergic rhinitis, unspecified: Secondary | ICD-10-CM | POA: Diagnosis not present

## 2020-08-17 NOTE — Progress Notes (Signed)
Vial exp 08-21-21 

## 2020-08-18 ENCOUNTER — Ambulatory Visit: Payer: BC Managed Care – PPO | Attending: Internal Medicine

## 2020-08-18 DIAGNOSIS — Z23 Encounter for immunization: Secondary | ICD-10-CM

## 2020-08-18 NOTE — Progress Notes (Signed)
   Covid-19 Vaccination Clinic  Name:  Olivia Cole    MRN: [id number] DOB: 1955/12/23  08/18/2020  Olivia Cole was observed post Covid-19 immunization for 15 minutes without incident. She was provided with Vaccine Information Sheet and instruction to access the V-Safe system.   Olivia Cole was instructed to call 911 with any severe reactions post vaccine: Marland Kitchen Difficulty breathing  . Swelling of face and throat  . A fast heartbeat  . A bad rash all over body  . Dizziness and weakness

## 2020-08-21 DIAGNOSIS — J302 Other seasonal allergic rhinitis: Secondary | ICD-10-CM | POA: Diagnosis not present

## 2020-08-28 ENCOUNTER — Ambulatory Visit (INDEPENDENT_AMBULATORY_CARE_PROVIDER_SITE_OTHER): Payer: BC Managed Care – PPO

## 2020-08-28 DIAGNOSIS — J309 Allergic rhinitis, unspecified: Secondary | ICD-10-CM | POA: Diagnosis not present

## 2020-09-11 ENCOUNTER — Ambulatory Visit (INDEPENDENT_AMBULATORY_CARE_PROVIDER_SITE_OTHER): Payer: BC Managed Care – PPO

## 2020-09-11 DIAGNOSIS — J309 Allergic rhinitis, unspecified: Secondary | ICD-10-CM

## 2020-09-25 ENCOUNTER — Ambulatory Visit (INDEPENDENT_AMBULATORY_CARE_PROVIDER_SITE_OTHER): Payer: BC Managed Care – PPO | Admitting: *Deleted

## 2020-09-25 DIAGNOSIS — J309 Allergic rhinitis, unspecified: Secondary | ICD-10-CM

## 2020-09-25 NOTE — Progress Notes (Signed)
EXP 09/27/21 G-W-T-D ONLY

## 2020-09-28 DIAGNOSIS — J3081 Allergic rhinitis due to animal (cat) (dog) hair and dander: Secondary | ICD-10-CM | POA: Diagnosis not present

## 2020-10-02 ENCOUNTER — Ambulatory Visit (INDEPENDENT_AMBULATORY_CARE_PROVIDER_SITE_OTHER): Payer: BC Managed Care – PPO | Admitting: *Deleted

## 2020-10-02 DIAGNOSIS — J309 Allergic rhinitis, unspecified: Secondary | ICD-10-CM | POA: Diagnosis not present

## 2020-10-03 NOTE — Progress Notes (Addendum)
12/7- called [facility] to set up patient's account and give verbal script for the Botox.  12/21- called [facility] to get an update on Order. Gave them PA info as well as appt date. Rep was going to expedite this to insurance so we can get the order processed.   12/30-called [facility] to get update; Rep said patient was given Copay Assistance info on 12/22 but they haven't heard back from her. I called patient and LMOM for her to call back with update.  1/4- called patient again for info; LMOM/ Patient called back and I gave her the info for the Botox Savings Program. She was calling them today to set up information. I also reached out to Botox Rep Brianne to see if we could expedite her enrollment into program. Awaiting E-mail back

## 2020-10-09 ENCOUNTER — Ambulatory Visit (INDEPENDENT_AMBULATORY_CARE_PROVIDER_SITE_OTHER): Payer: BC Managed Care – PPO | Admitting: *Deleted

## 2020-10-09 DIAGNOSIS — J309 Allergic rhinitis, unspecified: Secondary | ICD-10-CM

## 2020-10-16 ENCOUNTER — Ambulatory Visit (INDEPENDENT_AMBULATORY_CARE_PROVIDER_SITE_OTHER): Payer: BC Managed Care – PPO

## 2020-10-16 DIAGNOSIS — J309 Allergic rhinitis, unspecified: Secondary | ICD-10-CM | POA: Diagnosis not present

## 2020-10-24 ENCOUNTER — Ambulatory Visit (INDEPENDENT_AMBULATORY_CARE_PROVIDER_SITE_OTHER): Payer: BC Managed Care – PPO | Admitting: *Deleted

## 2020-10-24 DIAGNOSIS — J309 Allergic rhinitis, unspecified: Secondary | ICD-10-CM | POA: Diagnosis not present

## 2020-11-03 ENCOUNTER — Ambulatory Visit: Payer: BC Managed Care – PPO | Admitting: Neurology

## 2020-11-03 ENCOUNTER — Ambulatory Visit: Payer: Self-pay | Admitting: Neurology

## 2020-11-06 ENCOUNTER — Ambulatory Visit (INDEPENDENT_AMBULATORY_CARE_PROVIDER_SITE_OTHER): Payer: BC Managed Care – PPO

## 2020-11-06 DIAGNOSIS — J309 Allergic rhinitis, unspecified: Secondary | ICD-10-CM | POA: Diagnosis not present

## 2020-11-10 ENCOUNTER — Other Ambulatory Visit: Payer: Self-pay

## 2020-11-10 ENCOUNTER — Ambulatory Visit (INDEPENDENT_AMBULATORY_CARE_PROVIDER_SITE_OTHER): Payer: BC Managed Care – PPO | Admitting: Neurology

## 2020-11-10 DIAGNOSIS — G5132 Clonic hemifacial spasm, left: Secondary | ICD-10-CM | POA: Diagnosis not present

## 2020-11-10 MED ORDER — ONABOTULINUMTOXINA 100 UNITS IJ SOLR
100.0000 [IU] | Freq: Once | INTRAMUSCULAR | Status: AC
  Administered 2020-11-10: 100 [IU] via INTRAMUSCULAR

## 2020-11-10 NOTE — Procedures (Signed)
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  Risk discussed included, but were not limited pain and discomfort, bleeding, bruising, excessive weakness, venous thrombosis, muscle atrophy and dysphagia.  A copy of the patient medication guide was given to the patient which explains the blackbox warning.  Patients identity and treatment sites confirmed Yes.  .  Details of Procedure: Skin was cleaned with alcohol.   Prior to injection, the needle plunger was aspirated to make sure the needle was not within a blood vessel.  There was no blood retrieved on aspiration.    Following is a summary of the muscles injected  And the amount of Botulinum toxin used:  Injections  Location Left  Right Units Number of sites        Corrugator      Frontalis      Lower Lid, Lateral 2.5  2.5 1  Lower Lid Medial 2.5  2.5 1  Upper Lid, Lateral 2.5  2.5 1  Upper Lid, Medial 2.5  2.5 1  Canthus 2.5 2.5 5.0 1 each side  Temporalis      Masseter      Procerus      Zygomaticus Major      TOTAL UNITS:   15.0    Agent: Botulinum Type A ( Onobotulinum Toxin type A ).  1 vials of Botox were used, each containing 50 units and freshly diluted with 2 mL of sterile, non-perserved saline   Total injected (Units): 15.0  Total wasted (Units):85 Pt tolerated procedure well without complications.   Reinjection is anticipated in 3 months.

## 2020-11-14 ENCOUNTER — Ambulatory Visit (INDEPENDENT_AMBULATORY_CARE_PROVIDER_SITE_OTHER): Payer: BC Managed Care – PPO

## 2020-11-14 DIAGNOSIS — J309 Allergic rhinitis, unspecified: Secondary | ICD-10-CM | POA: Diagnosis not present

## 2020-11-21 ENCOUNTER — Other Ambulatory Visit: Payer: Self-pay

## 2020-11-21 ENCOUNTER — Encounter: Payer: Self-pay | Admitting: Allergy & Immunology

## 2020-11-21 ENCOUNTER — Ambulatory Visit: Payer: BC Managed Care – PPO | Admitting: Allergy & Immunology

## 2020-11-21 DIAGNOSIS — J309 Allergic rhinitis, unspecified: Secondary | ICD-10-CM | POA: Diagnosis not present

## 2020-11-21 DIAGNOSIS — J302 Other seasonal allergic rhinitis: Secondary | ICD-10-CM

## 2020-11-21 DIAGNOSIS — J3089 Other allergic rhinitis: Secondary | ICD-10-CM | POA: Insufficient documentation

## 2020-11-21 NOTE — Progress Notes (Signed)
FOLLOW UP  Date of Service/Encounter:  11/21/20   Assessment:   Seasonal and perennial allergic rhinitis(grasses, ragweed, weeds, trees, indoor molds, outdoor molds, dog and cockroach)  Large local reactions - improved with the use of triamcinolone administered after each injection   Plan/Recommendations:   1. Seasonal and perennial allergic rhinitis (grasses, ragweed, weeds, trees, indoor molds, outdoor molds, dog and cockroach) - I will clarify this about the dosing of your shots.  - Continue with on triamcinolone ointment applied to the injection site after each shot.  - Continue with: Zyrtec (cetirizine) 5-10mg  as needed (but definitely on shot days)  2. Return in about 1 year (around 11/21/2021).   Subjective:   Olivia Cole is a 65 y.o. female presenting today for follow up of  Chief Complaint  Patient presents with  . Allergic Rhinitis     Olivia Cole has a history of the following: Patient Active Problem List   Diagnosis Date Noted  . Seasonal and perennial allergic rhinitis 11/21/2020  . Facial spasm 08/02/2015  . Herniated lumbar intervertebral disc 09/01/2014    History obtained from: chart review and patient.  Olivia Cole is a 65 y.o. female presenting for a follow up visit.  She has a history of perennial and seasonal allergic rhinitis.  She was last seen a year ago and was doing very well at that time.  She had been slow to reach maintenance because of large local reactions, so we added triamcinolone to be applied after each injection.  She was continued on antihistamines.  Since last visit, she has done well.  She is now doing online classes. She does not like it online either but it safer. She is thinking that it is around 80% rate for students. She does still have the cabin. Her husband is active in Magazine features editor and actually works closely with the African American community on [employer] here in [place].  Allergic Rhinitis  Symptom History: Symptoms are essentially non existent. She is getting injections every 2 weeks when she reaches 0.5 mL. She misses no shots at all. She is not using any medicationas routinely but she does use antihistamines on her shot days. She was using cetirizine but this knocks her out. She has a new puppy (a beagle). She has just one now. She did have a space of time without a dog for around 6 months.    Olivia Cole is on allergen immunotherapy. She receives two injections. Immunotherapy script #1 contains trees, weeds, grasses and dog. She currently receives 0.75mL of the RED vial (1/100). Immunotherapy script #2 contains ragweed, molds and cockroach. She currently receives 0.11mL of the RED vial (1/100). She started shots November of 2020 and reached maintenance in March 2021.   Otherwise, there have been no changes to her past medical history, surgical history, family history, or social history.    Review of Systems  Constitutional: Negative.  Negative for chills, fever, malaise/fatigue and weight loss.  HENT: Negative for congestion, ear discharge, ear pain and sinus pain.        Positive for intermittent rhinorrhea.  Eyes: Negative for pain, discharge and redness.  Respiratory: Negative for cough, sputum production, shortness of breath and wheezing.   Cardiovascular: Negative.  Negative for chest pain and palpitations.  Gastrointestinal: Negative for abdominal pain, constipation, diarrhea, heartburn, nausea and vomiting.  Skin: Negative.  Negative for itching and rash.  Neurological: Negative for dizziness and headaches.  Endo/Heme/Allergies: Positive for environmental allergies. Does not bruise/bleed easily.  Objective:   Blood pressure 118/78, pulse 74, temperature 98 F (36.7 C), temperature source Temporal, resp. rate 18, height 5' 6.93" (1.7 m), weight 165 lb 12.8 oz (75.2 kg), SpO2 98 %. Body mass index is 26.02 kg/m.   Physical Exam:  Physical Exam Constitutional:       Appearance: She is well-developed.     Comments: Pleasant female. Cooperative with the exam.   HENT:     Head: Normocephalic and atraumatic.     Right Ear: Tympanic membrane, ear canal and external ear normal.     Left Ear: Tympanic membrane, ear canal and external ear normal.     Nose: No nasal deformity, septal deviation, mucosal edema, rhinorrhea or epistaxis.     Right Turbinates: Enlarged and swollen.     Left Turbinates: Swollen.     Right Sinus: No maxillary sinus tenderness or frontal sinus tenderness.     Left Sinus: No maxillary sinus tenderness or frontal sinus tenderness.     Comments: Scant clear rhinorrhea. No nasal polyps appreciated.     Mouth/Throat:     Mouth: Oropharynx is clear and moist. Mucous membranes are not pale and not dry.     Pharynx: Uvula midline.     Comments: There is some cobblestoning present.  Eyes:     General:        Right eye: No discharge.        Left eye: No discharge.     Extraocular Movements: EOM normal.     Conjunctiva/sclera: Conjunctivae normal.     Right eye: Right conjunctiva is not injected. No chemosis.    Left eye: Left conjunctiva is not injected. No chemosis.    Pupils: Pupils are equal, round, and reactive to light.  Cardiovascular:     Rate and Rhythm: Normal rate and regular rhythm.     Heart sounds: Normal heart sounds.  Pulmonary:     Effort: Pulmonary effort is normal. No tachypnea, accessory muscle usage or respiratory distress.     Breath sounds: Normal breath sounds. No wheezing, rhonchi or rales.  Chest:     Chest wall: No tenderness.  Lymphadenopathy:     Cervical: No cervical adenopathy.  Skin:    General: Skin is warm.     Capillary Refill: Capillary refill takes less than 2 seconds.     Coloration: Skin is not pale.     Findings: No abrasion, erythema, petechiae or rash. Rash is not papular, urticarial or vesicular.     Comments: No eczematous or urticarial lesions noted.   Neurological:     Mental  Status: She is alert.  Psychiatric:        Mood and Affect: Mood and affect normal.      Diagnostic studies: none    Olivia Bonds, MD  [facility]

## 2020-11-21 NOTE — Patient Instructions (Addendum)
1. Seasonal and perennial allergic rhinitis (grasses, ragweed, weeds, trees, indoor molds, outdoor molds, dog and cockroach) - I will clarify this about the dosing of your shots.  - Continue with on triamcinolone ointment applied to the injection site after each shot.  - Continue with: Zyrtec (cetirizine) 5-10mg  as needed (but definitely on shot days)  2. Return in about 1 year (around 11/21/2021).    Please inform us of any Emergency Department visits, hospitalizations, or changes in symptoms. Call us before going to the ED for breathing or allergy symptoms since we might be able to fit you in for a sick visit. Feel free to contact us anytime with any questions, problems, or concerns.  It was a pleasure to see you again today!  Websites that have reliable patient information: 1. American Academy of Asthma, Allergy, and Immunology: www.aaaai.org 2. Food Allergy Research and Education (FARE): foodallergy.org 3. Mothers of Asthmatics: http://www.asthmacommunitynetwork.org 4. American College of Allergy, Asthma, and Immunology: www.acaai.org   COVID-19 Vaccine Information can be found at: [email or web address] For questions related to vaccine distribution or appointments, please email [email or web address] or call [phone].     "Like" [place] on Facebook and Instagram for our latest updates!       Make sure you are registered to vote! If you have moved or changed any of your contact information, you will need to get this updated before voting!  In some cases, you MAY be able to register to vote online: [email or web address]

## 2020-11-27 ENCOUNTER — Ambulatory Visit (INDEPENDENT_AMBULATORY_CARE_PROVIDER_SITE_OTHER): Payer: BC Managed Care – PPO

## 2020-11-27 DIAGNOSIS — J309 Allergic rhinitis, unspecified: Secondary | ICD-10-CM | POA: Diagnosis not present

## 2020-12-05 ENCOUNTER — Ambulatory Visit (INDEPENDENT_AMBULATORY_CARE_PROVIDER_SITE_OTHER): Payer: BC Managed Care – PPO | Admitting: *Deleted

## 2020-12-05 DIAGNOSIS — J309 Allergic rhinitis, unspecified: Secondary | ICD-10-CM

## 2020-12-11 ENCOUNTER — Ambulatory Visit (INDEPENDENT_AMBULATORY_CARE_PROVIDER_SITE_OTHER): Payer: BC Managed Care – PPO | Admitting: *Deleted

## 2020-12-11 DIAGNOSIS — J309 Allergic rhinitis, unspecified: Secondary | ICD-10-CM | POA: Diagnosis not present

## 2020-12-11 NOTE — Progress Notes (Signed)
VIAL EXP 12-11-21 

## 2020-12-12 DIAGNOSIS — J302 Other seasonal allergic rhinitis: Secondary | ICD-10-CM | POA: Diagnosis not present

## 2020-12-18 ENCOUNTER — Ambulatory Visit (INDEPENDENT_AMBULATORY_CARE_PROVIDER_SITE_OTHER): Payer: BC Managed Care – PPO

## 2020-12-18 DIAGNOSIS — J309 Allergic rhinitis, unspecified: Secondary | ICD-10-CM | POA: Diagnosis not present

## 2021-01-02 ENCOUNTER — Ambulatory Visit (INDEPENDENT_AMBULATORY_CARE_PROVIDER_SITE_OTHER): Payer: BC Managed Care – PPO | Admitting: *Deleted

## 2021-01-02 DIAGNOSIS — J309 Allergic rhinitis, unspecified: Secondary | ICD-10-CM

## 2021-01-16 ENCOUNTER — Ambulatory Visit (INDEPENDENT_AMBULATORY_CARE_PROVIDER_SITE_OTHER): Payer: BC Managed Care – PPO | Admitting: *Deleted

## 2021-01-16 DIAGNOSIS — J309 Allergic rhinitis, unspecified: Secondary | ICD-10-CM

## 2021-01-22 ENCOUNTER — Ambulatory Visit (INDEPENDENT_AMBULATORY_CARE_PROVIDER_SITE_OTHER): Payer: BC Managed Care – PPO

## 2021-01-22 DIAGNOSIS — J309 Allergic rhinitis, unspecified: Secondary | ICD-10-CM | POA: Diagnosis not present

## 2021-01-29 ENCOUNTER — Ambulatory Visit (INDEPENDENT_AMBULATORY_CARE_PROVIDER_SITE_OTHER): Payer: BC Managed Care – PPO | Admitting: *Deleted

## 2021-01-29 DIAGNOSIS — J309 Allergic rhinitis, unspecified: Secondary | ICD-10-CM

## 2021-02-06 ENCOUNTER — Ambulatory Visit (INDEPENDENT_AMBULATORY_CARE_PROVIDER_SITE_OTHER): Payer: BC Managed Care – PPO | Admitting: *Deleted

## 2021-02-06 DIAGNOSIS — J309 Allergic rhinitis, unspecified: Secondary | ICD-10-CM | POA: Diagnosis not present

## 2021-02-13 ENCOUNTER — Ambulatory Visit (INDEPENDENT_AMBULATORY_CARE_PROVIDER_SITE_OTHER): Payer: BC Managed Care – PPO | Admitting: *Deleted

## 2021-02-13 DIAGNOSIS — J309 Allergic rhinitis, unspecified: Secondary | ICD-10-CM

## 2021-02-16 ENCOUNTER — Other Ambulatory Visit: Payer: Self-pay

## 2021-02-16 ENCOUNTER — Ambulatory Visit: Payer: Self-pay | Admitting: Neurology

## 2021-02-16 ENCOUNTER — Ambulatory Visit: Payer: BC Managed Care – PPO | Admitting: Neurology

## 2021-02-16 DIAGNOSIS — G5132 Clonic hemifacial spasm, left: Secondary | ICD-10-CM | POA: Diagnosis not present

## 2021-02-16 MED ORDER — ONABOTULINUMTOXINA 100 UNITS IJ SOLR
100.0000 [IU] | Freq: Once | INTRAMUSCULAR | Status: AC
  Administered 2021-02-16: 100 [IU] via INTRAMUSCULAR

## 2021-02-19 NOTE — Procedures (Signed)
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  Risk discussed included, but were not limited pain and discomfort, bleeding, bruising, excessive weakness, venous thrombosis, muscle atrophy and dysphagia.  A copy of the patient medication guide was given to the patient which explains the blackbox warning.  Patients identity and treatment sites confirmed Yes.  .  Details of Procedure: Skin was cleaned with alcohol.   Prior to injection, the needle plunger was aspirated to make sure the needle was not within a blood vessel.  There was no blood retrieved on aspiration.    Following is a summary of the muscles injected  And the amount of Botulinum toxin used:  Injections  Location Left  Right Units Number of sites        Corrugator      Frontalis      Lower Lid, Lateral 2.5  2.5 1  Lower Lid Medial 2.5  2.5 1  Upper Lid, Lateral 2.5  2.5 1  Upper Lid, Medial 2.5  2.5 1  Canthus 2.5 2.5 5.0 1 each side  Temporalis      Masseter      Procerus      Zygomaticus Major      TOTAL UNITS:   15.0    Agent: Botulinum Type A ( Onobotulinum Toxin type A ).  1 vials of Botox were used, each containing 50 units and freshly diluted with 2 mL of sterile, non-perserved saline   Total injected (Units): 15.0  Total wasted (Units):85 Pt tolerated procedure well without complications.   Reinjection is anticipated in 3 months.

## 2021-02-20 DIAGNOSIS — J3081 Allergic rhinitis due to animal (cat) (dog) hair and dander: Secondary | ICD-10-CM | POA: Diagnosis not present

## 2021-02-20 NOTE — Progress Notes (Signed)
VIAL EXP 02-20-22 

## 2021-02-26 ENCOUNTER — Ambulatory Visit (INDEPENDENT_AMBULATORY_CARE_PROVIDER_SITE_OTHER): Payer: BC Managed Care – PPO

## 2021-02-26 DIAGNOSIS — J309 Allergic rhinitis, unspecified: Secondary | ICD-10-CM | POA: Diagnosis not present

## 2021-03-13 ENCOUNTER — Ambulatory Visit (INDEPENDENT_AMBULATORY_CARE_PROVIDER_SITE_OTHER): Payer: BC Managed Care – PPO | Admitting: *Deleted

## 2021-03-13 DIAGNOSIS — J309 Allergic rhinitis, unspecified: Secondary | ICD-10-CM

## 2021-03-27 ENCOUNTER — Ambulatory Visit (INDEPENDENT_AMBULATORY_CARE_PROVIDER_SITE_OTHER): Payer: BC Managed Care – PPO | Admitting: *Deleted

## 2021-03-27 DIAGNOSIS — J309 Allergic rhinitis, unspecified: Secondary | ICD-10-CM

## 2021-04-02 ENCOUNTER — Ambulatory Visit (INDEPENDENT_AMBULATORY_CARE_PROVIDER_SITE_OTHER): Payer: BC Managed Care – PPO

## 2021-04-02 DIAGNOSIS — J309 Allergic rhinitis, unspecified: Secondary | ICD-10-CM | POA: Diagnosis not present

## 2021-04-04 DIAGNOSIS — J302 Other seasonal allergic rhinitis: Secondary | ICD-10-CM | POA: Diagnosis not present

## 2021-04-04 NOTE — Progress Notes (Signed)
VIALS MADE & EXP 04-04-22 

## 2021-04-09 ENCOUNTER — Ambulatory Visit (INDEPENDENT_AMBULATORY_CARE_PROVIDER_SITE_OTHER): Payer: BC Managed Care – PPO

## 2021-04-09 DIAGNOSIS — J309 Allergic rhinitis, unspecified: Secondary | ICD-10-CM

## 2021-04-16 ENCOUNTER — Ambulatory Visit (INDEPENDENT_AMBULATORY_CARE_PROVIDER_SITE_OTHER): Payer: BC Managed Care – PPO

## 2021-04-16 DIAGNOSIS — J309 Allergic rhinitis, unspecified: Secondary | ICD-10-CM | POA: Diagnosis not present

## 2021-04-23 ENCOUNTER — Ambulatory Visit (INDEPENDENT_AMBULATORY_CARE_PROVIDER_SITE_OTHER): Payer: BC Managed Care – PPO

## 2021-04-23 DIAGNOSIS — J309 Allergic rhinitis, unspecified: Secondary | ICD-10-CM | POA: Diagnosis not present

## 2021-05-03 ENCOUNTER — Telehealth: Payer: Self-pay | Admitting: Neurology

## 2021-05-03 NOTE — Telephone Encounter (Signed)
Olivia Cole called in [facility]. They need to speak with someone about the status of an authorization.

## 2021-05-04 NOTE — Telephone Encounter (Signed)
Prior authorization key to come

## 2021-05-04 NOTE — Progress Notes (Unsigned)
The key is coming over by via. To access. [facility]  [id number]. \

## 2021-05-07 ENCOUNTER — Encounter: Payer: Self-pay | Admitting: *Deleted

## 2021-05-07 ENCOUNTER — Ambulatory Visit (INDEPENDENT_AMBULATORY_CARE_PROVIDER_SITE_OTHER): Payer: BC Managed Care – PPO

## 2021-05-07 DIAGNOSIS — J309 Allergic rhinitis, unspecified: Secondary | ICD-10-CM | POA: Diagnosis not present

## 2021-05-07 NOTE — Progress Notes (Signed)
Looked up new PA on COverMyMeds and the following was on the key. I believe it was submitted as a pharmacy claim not medical but I will research. SRR  Oza Covey Key: [id number]  help_outline NEED HELP? [phone] error Pharmacy claim for Botox 100UNIT solution, prescriber Enedina Finner, has been rejected and requires prior authorization for coverage. Non-preferred medications may have a higher patient co-pay than the health insurance plan's preferred medications. Please research and evaluate therapy options for this patient. You can learn more about alternative medications by calling [facility] NCPDP 2017 at [phone], or checking their online formulary.

## 2021-05-07 NOTE — Progress Notes (Signed)
Submitted through [employer] due to CoverMyMeds sending it through pharmacy benefits.  Zoriana, Oats 04/04/2020 [id number] BV Full  02/16/2021 05/11/2021 [facility] Lurena Joiner Tat Prior Authorization Required  Listed [facility] called and spoke with insurance representative to get the NPI and Fax number  NPI for [facility]. [phone] Fax# [phone]

## 2021-05-10 ENCOUNTER — Telehealth: Payer: Self-pay | Admitting: Neurology

## 2021-05-10 NOTE — Telephone Encounter (Signed)
Thayer Ohm from [facility] called to check the status of the prior authorization for the patient's Botox. She stated the patient has called them asking about it also.

## 2021-05-15 NOTE — Telephone Encounter (Signed)
Thayer Ohm from [facility] called to check the status of the prior authorization for the patient's Botox. She said patient has been calling them. They are wanting an update for the patient.

## 2021-05-16 NOTE — Telephone Encounter (Signed)
Pending as of 05/07/2021 will check with susan

## 2021-05-18 ENCOUNTER — Encounter: Payer: Self-pay | Admitting: *Deleted

## 2021-05-18 NOTE — Progress Notes (Signed)
Called [employer] [phone] spoke with Bonita Quin. She said need new PA and cannot do over the phone. I submitted through COVERMYMEDS as follows:  Francis Froemming (Key: [id number])  Your information has been submitted to [facility]. [employer] [place] will review the request and notify you of the determination decision directly, typically within 72 hours of receiving all information.  You will also receive your request decision electronically. To check for an update later, open this request again from your dashboard.  If [employer] [place] has not responded within the specified timeframe or if you have any questions about your PA submission, contact [employer] [place] directly at [phone].  NOTE: I used [facility] as specialty pharmacy because I do not have the phone number of the [facility] pharmacy nor the NPI of said pharmacy and that information is required to submit a PA

## 2021-05-21 ENCOUNTER — Ambulatory Visit (INDEPENDENT_AMBULATORY_CARE_PROVIDER_SITE_OTHER): Payer: BC Managed Care – PPO

## 2021-05-21 DIAGNOSIS — J309 Allergic rhinitis, unspecified: Secondary | ICD-10-CM | POA: Diagnosis not present

## 2021-05-22 NOTE — Telephone Encounter (Signed)
Pateint called and looks like botox was restarted by Darl Pikes

## 2021-05-23 ENCOUNTER — Telehealth: Payer: Self-pay | Admitting: *Deleted

## 2021-05-23 NOTE — Progress Notes (Addendum)
Received approval through COVERMYMEDS all it said was approved July 25th  Approval valid  07/222022-05/17/2022  Unable to locate the fax so I called and requested another fax.  The specialty pharmacy called and I spoke to Velva her number is [phone] ext [phone]  she looked at approval on COVERMYMEDS I gave her the key that it was approved on.   She tried to run it and it did not go through. She then said "oh" it is under her medical benefit. I said yes and she said she did not know how to run it under medical benefit so she was going to ask someone then get back to me.  Called [employer] spoke with Geroge Baseman [phone] She contacted the pharmacy department to fax again. She said even though it is medical benefit the fax comes from the pharmacy department.

## 2021-05-23 NOTE — Telephone Encounter (Signed)
Updated Olivia Cole of the progress on her Botox PA. It was approved and valid from 05/18/21-05/17/21 Let her  know we are working with the pharmacy to get the medication delivered and that we requested a fax from [facility] for the approval letter which does not appear to be available in the office at present. They said it should arrive in our office in the next 5 minutes.  She was very appreciative of our effort.

## 2021-05-28 NOTE — Telephone Encounter (Signed)
Called Eilyn to update her on the PA for Botox.  Called [facility] [phone] option #3. Spoke with Alcario Drought she ran a test claim it again stated a PA was needed I reiterated that it is under medical, not pharmacy, and asked her to look at the fax I sent 05/24/2021. She said yes the fax was in Jia's chart and she sent an email over to medical. They will run a test claim to verify the authorization then call Naquita to set up delivery.

## 2021-05-30 ENCOUNTER — Other Ambulatory Visit: Payer: Self-pay

## 2021-05-30 ENCOUNTER — Encounter: Payer: Self-pay | Admitting: *Deleted

## 2021-05-30 MED ORDER — BOTOX 100 UNITS IJ SOLR: 100.0000 [IU] | INTRAMUSCULAR | 0 refills | Status: DC

## 2021-05-30 MED ORDER — BOTOX 100 UNITS IJ SOLR: INTRAMUSCULAR | 0 refills | Status: DC

## 2021-05-30 NOTE — Progress Notes (Signed)
Notice of Approval Date: 05/30/2021 [facility] [address] [place], [place] [address] Plan Member Name: JASPREET BODNER Plan Member ID: [id number] Prescriber Name: Kerin Salen Prescriber Phone: [phone] Prescriber Fax: [phone] Dear Carmela Rima Sarr: [facility] received a request from your prescriber for coverage of Botox. As long as you remain covered by the [facility] and there are no changes to your plan benefits, this request is approved for the following time period: 05/30/2021 - 05/30/2022 Approvals may be subject to dosing limits in accordance with FDA approved labeling, evidence-based practice guidelines or your prescription drug plan benefits. The prescription drug plan requires that this medication be filled through [facility]. If you have not done so already, a prescription can be faxed to [phone] along with a copy of this letter and the request will be processed. Sincerely, [facility] PA# [facility] Plan [phone] L

## 2021-05-30 NOTE — Addendum Note (Signed)
Addended by: Karl Luke A on: 05/30/2021 04:22 PM   Modules accepted: Orders

## 2021-05-30 NOTE — Progress Notes (Addendum)
The [facility] does not contract with Medical authorization only pharmacy PA. I called [facility] and they also only dispense from approved pharmacy PAs. So I have resubmitted the PA through the pharmacy benefit we will not use the medical benefit authorization.  Olivia Cole (Key: [id number])  [facility] is processing your PA request and will respond shortly with next steps. You are currently using the fastest method to process this prior authorization. Please do not fax or call [facility] to resubmit this request. To check for an update later, open this request again from your dashboard.  Olivia Cole (Key: [id number])  Your demographic data has been sent to [facility] successfully!  [facility] typically takes 5-10 minutes to respond, but it may take a little longer in some cases. You will be notified by email when available. You can also check for an update later by opening this request from your dashboard. Please do not fax or call [facility] to resubmit this request. If you need assistance, please chat with CoverMyMeds or call us at [phone].  If it has been longer than 24 hours, please reach out to [facility].

## 2021-05-31 NOTE — Progress Notes (Signed)
[facility]. I sent it to scan into chart. It does not make sense to me so I called [employer] [facility] [phone] spoke with Zachery Conch he ran a test claim and it came back ok so they will reach out to [facility] for her to give consent for delivery. Her copay is $0  Once they get her consent they will contact us to approve receipt of drug at our office.

## 2021-05-31 NOTE — Progress Notes (Addendum)
Faxed a prescription along with the approval letter. To [facility] fax#[phone] today.  Sent letter of approval to scan into her chart. Approval valid 05/30/2021-05/30/2022

## 2021-06-01 ENCOUNTER — Ambulatory Visit: Payer: BC Managed Care – PPO | Admitting: Neurology

## 2021-06-07 ENCOUNTER — Ambulatory Visit (INDEPENDENT_AMBULATORY_CARE_PROVIDER_SITE_OTHER): Payer: BC Managed Care – PPO | Admitting: *Deleted

## 2021-06-07 ENCOUNTER — Telehealth: Payer: Self-pay | Admitting: Neurology

## 2021-06-07 DIAGNOSIS — J309 Allergic rhinitis, unspecified: Secondary | ICD-10-CM | POA: Diagnosis not present

## 2021-06-07 NOTE — Telephone Encounter (Signed)
Pt would like to know if her botox arrived. She has a note saying it was waiting for consent from pt to send. [phone]

## 2021-06-08 ENCOUNTER — Ambulatory Visit: Payer: BC Managed Care – PPO | Admitting: Neurology

## 2021-06-08 ENCOUNTER — Other Ambulatory Visit: Payer: Self-pay

## 2021-06-08 DIAGNOSIS — G5132 Clonic hemifacial spasm, left: Secondary | ICD-10-CM | POA: Diagnosis not present

## 2021-06-08 MED ORDER — ONABOTULINUMTOXINA 100 UNITS IJ SOLR
100.0000 [IU] | Freq: Once | INTRAMUSCULAR | Status: AC
  Administered 2021-06-08: 15 [IU] via INTRAMUSCULAR

## 2021-06-08 NOTE — Procedures (Signed)
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  Risk discussed included, but were not limited pain and discomfort, bleeding, bruising, excessive weakness, venous thrombosis, muscle atrophy and dysphagia.  A copy of the patient medication guide was given to the patient which explains the blackbox warning.  Patients identity and treatment sites confirmed Yes.  .  Details of Procedure: Skin was cleaned with alcohol.   Prior to injection, the needle plunger was aspirated to make sure the needle was not within a blood vessel.  There was no blood retrieved on aspiration.    Following is a summary of the muscles injected  And the amount of Botulinum toxin used:  Injections  Location Left  Right Units Number of sites        Corrugator      Frontalis      Lower Lid, Lateral 2.5  2.5 1  Lower Lid Medial 2.5  2.5 1  Upper Lid, Lateral 2.5  2.5 1  Upper Lid, Medial 2.5  2.5 1  Canthus 2.5 2.5 5.0 1 each side  Temporalis      Masseter      Procerus      Zygomaticus Major      TOTAL UNITS:   15.0    Agent: Botulinum Type A ( Onobotulinum Toxin type A ).  1 vials of Botox were used, each containing 50 units and freshly diluted with 2 mL of sterile, non-perserved saline   Total injected (Units): 15.0  Total wasted (Units):85 Pt tolerated procedure well without complications.   Reinjection is anticipated in 3 months.

## 2021-06-13 ENCOUNTER — Ambulatory Visit (INDEPENDENT_AMBULATORY_CARE_PROVIDER_SITE_OTHER): Payer: BC Managed Care – PPO

## 2021-06-13 DIAGNOSIS — J309 Allergic rhinitis, unspecified: Secondary | ICD-10-CM | POA: Diagnosis not present

## 2021-06-19 ENCOUNTER — Ambulatory Visit (INDEPENDENT_AMBULATORY_CARE_PROVIDER_SITE_OTHER): Payer: BC Managed Care – PPO | Admitting: *Deleted

## 2021-06-19 DIAGNOSIS — J309 Allergic rhinitis, unspecified: Secondary | ICD-10-CM

## 2021-06-19 NOTE — Progress Notes (Signed)
VIAL MADE. EXP 06-19-22 

## 2021-06-20 DIAGNOSIS — J3081 Allergic rhinitis due to animal (cat) (dog) hair and dander: Secondary | ICD-10-CM

## 2021-06-29 ENCOUNTER — Ambulatory Visit (INDEPENDENT_AMBULATORY_CARE_PROVIDER_SITE_OTHER): Payer: BC Managed Care – PPO

## 2021-06-29 ENCOUNTER — Ambulatory Visit: Payer: BC Managed Care – PPO | Admitting: Neurology

## 2021-06-29 DIAGNOSIS — J309 Allergic rhinitis, unspecified: Secondary | ICD-10-CM | POA: Diagnosis not present

## 2021-07-09 ENCOUNTER — Ambulatory Visit (INDEPENDENT_AMBULATORY_CARE_PROVIDER_SITE_OTHER): Payer: BC Managed Care – PPO | Admitting: *Deleted

## 2021-07-09 DIAGNOSIS — J309 Allergic rhinitis, unspecified: Secondary | ICD-10-CM

## 2021-07-23 ENCOUNTER — Ambulatory Visit (INDEPENDENT_AMBULATORY_CARE_PROVIDER_SITE_OTHER): Payer: BC Managed Care – PPO

## 2021-07-23 DIAGNOSIS — J309 Allergic rhinitis, unspecified: Secondary | ICD-10-CM

## 2021-08-06 ENCOUNTER — Ambulatory Visit (INDEPENDENT_AMBULATORY_CARE_PROVIDER_SITE_OTHER): Payer: BC Managed Care – PPO

## 2021-08-06 DIAGNOSIS — J309 Allergic rhinitis, unspecified: Secondary | ICD-10-CM

## 2021-08-14 ENCOUNTER — Ambulatory Visit (INDEPENDENT_AMBULATORY_CARE_PROVIDER_SITE_OTHER): Payer: BC Managed Care – PPO

## 2021-08-14 DIAGNOSIS — J309 Allergic rhinitis, unspecified: Secondary | ICD-10-CM

## 2021-08-17 ENCOUNTER — Telehealth: Payer: Self-pay

## 2021-08-17 NOTE — Telephone Encounter (Signed)
New message   I called & left a voicemail at [phone] and advised the patient to contact her [facility] at [phone] for Botox consent delivery to the office for an upcoming appt on 09/07/21 with Dr. Arbutus Leas.

## 2021-08-17 NOTE — Telephone Encounter (Signed)
F/u   I called [facility] at [phone] and spoke with a representative advise to call back at the end of the month.   The patient has a copayment of $ 173.58

## 2021-08-22 ENCOUNTER — Ambulatory Visit (INDEPENDENT_AMBULATORY_CARE_PROVIDER_SITE_OTHER): Payer: BC Managed Care – PPO

## 2021-08-22 DIAGNOSIS — J309 Allergic rhinitis, unspecified: Secondary | ICD-10-CM | POA: Diagnosis not present

## 2021-08-31 ENCOUNTER — Ambulatory Visit (INDEPENDENT_AMBULATORY_CARE_PROVIDER_SITE_OTHER): Payer: BC Managed Care – PPO

## 2021-08-31 DIAGNOSIS — J309 Allergic rhinitis, unspecified: Secondary | ICD-10-CM | POA: Diagnosis not present

## 2021-09-07 ENCOUNTER — Ambulatory Visit: Payer: BC Managed Care – PPO | Admitting: Neurology

## 2021-09-07 ENCOUNTER — Other Ambulatory Visit: Payer: Self-pay

## 2021-09-07 DIAGNOSIS — G5132 Clonic hemifacial spasm, left: Secondary | ICD-10-CM

## 2021-09-07 MED ORDER — ONABOTULINUMTOXINA 100 UNITS IJ SOLR
100.0000 [IU] | Freq: Once | INTRAMUSCULAR | Status: AC
  Administered 2021-09-07: 100 [IU] via INTRAMUSCULAR

## 2021-09-07 MED ORDER — ONABOTULINUMTOXINA 100 UNITS IJ SOLR
100.0000 [IU] | Freq: Once | INTRAMUSCULAR | Status: AC
  Administered 2021-09-07: 15 [IU] via INTRAMUSCULAR

## 2021-09-07 NOTE — Addendum Note (Signed)
Addended by: Dimas Chyle on: 09/07/2021 12:08 PM   Modules accepted: Orders

## 2021-09-07 NOTE — Addendum Note (Signed)
Addended by: Dimas Chyle on: 09/07/2021 12:17 PM   Modules accepted: Orders

## 2021-09-07 NOTE — Procedures (Signed)
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  Risk discussed included, but were not limited pain and discomfort, bleeding, bruising, excessive weakness, venous thrombosis, muscle atrophy and dysphagia.  A copy of the patient medication guide was given to the patient which explains the blackbox warning.  Patients identity and treatment sites confirmed Yes.  .  Details of Procedure: Skin was cleaned with alcohol.   Prior to injection, the needle plunger was aspirated to make sure the needle was not within a blood vessel.  There was no blood retrieved on aspiration.    Following is a summary of the muscles injected  And the amount of Botulinum toxin used:  Injections  Location Left  Right Units Number of sites        Corrugator      Frontalis      Lower Lid, Lateral 2.5  2.5 1  Lower Lid Medial 2.5  2.5 1  Upper Lid, Lateral 2.5  2.5 1  Upper Lid, Medial 2.5  2.5 1  Canthus 2.5 2.5 5.0 1 each side  Temporalis      Masseter      Procerus      Zygomaticus Major      TOTAL UNITS:   15.0    Agent: Botulinum Type A ( Onobotulinum Toxin type A ).  1 vials of Botox were used, each containing 50 units and freshly diluted with 2 mL of sterile, non-perserved saline   Total injected (Units): 15.0  Total wasted (Units):85 Pt tolerated procedure well without complications.   Reinjection is anticipated in 3 months.

## 2021-09-07 NOTE — Addendum Note (Signed)
Addended by: Kerin Salen S on: 09/07/2021 01:51 PM   Modules accepted: Orders

## 2021-09-25 ENCOUNTER — Ambulatory Visit (INDEPENDENT_AMBULATORY_CARE_PROVIDER_SITE_OTHER): Payer: BC Managed Care – PPO | Admitting: *Deleted

## 2021-09-25 DIAGNOSIS — J309 Allergic rhinitis, unspecified: Secondary | ICD-10-CM

## 2021-10-09 NOTE — Progress Notes (Signed)
EXP 10/11/22 °

## 2021-10-11 DIAGNOSIS — J3081 Allergic rhinitis due to animal (cat) (dog) hair and dander: Secondary | ICD-10-CM

## 2021-10-16 ENCOUNTER — Ambulatory Visit (INDEPENDENT_AMBULATORY_CARE_PROVIDER_SITE_OTHER): Payer: BC Managed Care – PPO | Admitting: *Deleted

## 2021-10-16 DIAGNOSIS — J309 Allergic rhinitis, unspecified: Secondary | ICD-10-CM | POA: Diagnosis not present

## 2021-10-29 ENCOUNTER — Other Ambulatory Visit: Payer: Self-pay | Admitting: Neurology

## 2021-10-31 ENCOUNTER — Other Ambulatory Visit: Payer: Self-pay

## 2021-11-07 ENCOUNTER — Ambulatory Visit (INDEPENDENT_AMBULATORY_CARE_PROVIDER_SITE_OTHER): Payer: BC Managed Care – PPO

## 2021-11-07 DIAGNOSIS — J309 Allergic rhinitis, unspecified: Secondary | ICD-10-CM | POA: Diagnosis not present

## 2021-11-28 ENCOUNTER — Ambulatory Visit (INDEPENDENT_AMBULATORY_CARE_PROVIDER_SITE_OTHER): Payer: BC Managed Care – PPO | Admitting: *Deleted

## 2021-11-28 DIAGNOSIS — J309 Allergic rhinitis, unspecified: Secondary | ICD-10-CM | POA: Diagnosis not present

## 2021-12-03 ENCOUNTER — Ambulatory Visit (INDEPENDENT_AMBULATORY_CARE_PROVIDER_SITE_OTHER): Payer: BC Managed Care – PPO

## 2021-12-03 DIAGNOSIS — J309 Allergic rhinitis, unspecified: Secondary | ICD-10-CM

## 2021-12-04 ENCOUNTER — Ambulatory Visit (INDEPENDENT_AMBULATORY_CARE_PROVIDER_SITE_OTHER): Payer: BC Managed Care – PPO | Admitting: Allergy & Immunology

## 2021-12-04 ENCOUNTER — Encounter: Payer: Self-pay | Admitting: Allergy & Immunology

## 2021-12-04 ENCOUNTER — Telehealth: Payer: Self-pay | Admitting: *Deleted

## 2021-12-04 ENCOUNTER — Other Ambulatory Visit: Payer: Self-pay

## 2021-12-04 VITALS — BP 118/76 | HR 75 | Temp 97.7°F | Resp 16 | Ht 67.0 in | Wt 163.1 lb

## 2021-12-04 DIAGNOSIS — J302 Other seasonal allergic rhinitis: Secondary | ICD-10-CM

## 2021-12-04 DIAGNOSIS — J3089 Other allergic rhinitis: Secondary | ICD-10-CM | POA: Diagnosis not present

## 2021-12-04 DIAGNOSIS — L2089 Other atopic dermatitis: Secondary | ICD-10-CM

## 2021-12-04 MED ORDER — CLOBETASOL PROPIONATE 0.05 % EX SHAM: 1.0000 "application " | MEDICATED_SHAMPOO | CUTANEOUS | 0 refills | Status: DC

## 2021-12-04 MED ORDER — TRIAMCINOLONE ACETONIDE 0.5 % EX CREA: 1.0000 "application " | TOPICAL_CREAM | Freq: Three times a day (TID) | CUTANEOUS | 0 refills | Status: DC

## 2021-12-04 NOTE — Telephone Encounter (Signed)
Patient's allergy flow sheet has been updated to reflect these changes.  

## 2021-12-04 NOTE — Progress Notes (Signed)
FOLLOW UP  Date of Service/Encounter:  12/04/21   Assessment:   Seasonal and perennial allergic rhinitis (grasses, ragweed, weeds, trees, indoor molds, outdoor molds, dog and cockroach)   Large local reactions - resolved  Eczema - on the scalp    Plan/Recommendations:   1. Seasonal and perennial allergic rhinitis (grasses, ragweed, weeds, trees, indoor molds, outdoor molds, dog and cockroach) - We will put you on a quicker build up for New Vials and space you to every 4 weeks.  - Continue with: Zyrtec (cetirizine) 5-10mg  as needed (but definitely on shot days)  2. Eczema - Refill provided for triamcinolone 0.5% cream. - Start clobetasol shampoo 1 application at least once a week to provide anti-inflammatory effects, but can be used up to twice daily.  3. Return in about 1 year (around 12/04/2022).    Subjective:   Olivia Cole is a 66 y.o. female presenting today for follow up of  Chief Complaint  Patient presents with   Follow-up   Medication Refill    Olivia Cole has a history of the following: Patient Active Problem List   Diagnosis Date Noted   Seasonal and perennial allergic rhinitis 11/21/2020   Facial spasm 08/02/2015   Herniated lumbar intervertebral disc 09/01/2014    History obtained from: chart review and patient.  Olivia Cole is a 66 y.o. female presenting for a follow up visit.  She was last seen in January 2022.  Time, continue with allergy shots at the same schedule.  We also continue with Zyrtec 5 to 10 mg as needed, but especially on short days.  She was doing triamcinolone ointment applied to injection site after each shot.  In the interim, she has done well.  She feels like a new person but the allergy shots on board.  She has not been using her Zyrtec on shot days and is doing great.  She is kind of frustrated with the slow advance when she starts new vials and she would like to get to every 4 weeks [place] both [place].  She has not needed antibiotics and she has not been on nasal sprays. She is no longer having the large swelling around her injection sites.   Olivia Cole is on allergen immunotherapy. She receives two injections. Immunotherapy script #1 contains trees, weeds, grasses and dog. She currently receives 0.8mL of the RED vial (1/100). Immunotherapy script #2 contains ragweed, molds and cockroach. She currently receives 0.51mL of the RED vial (1/100). She started shots November of 2020 and reached maintenance in March 2021.   She does report that she is needing a refill of her triamcinolone 0.5% cream.  She does not like the ointments at all.  She also is having an eczema flare in her scalp.  This is midline and within her hairline in the occipital region.  She is wondering if there is some kind of topical steroid that is better to use in the ear.  She is retired August 2022. She is not sure what she is going to do in her second career. 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 She is not moving to her cabin full time.   Her husband is doing civil rights work. She did some work with [employer], but she has not done as much with this since the pandemic.  Her husband is fully retired but still continues to fully "work for nothing".  He spends a lot of time with his civil rights work and he is Chief Financial Officer.  He also has a former colleague who is writing a biography on him.  It is set to be published this year.  Otherwise, there have been no changes to her past medical history, surgical history, family history, or social history.    Review of Systems  Constitutional: Negative.  Negative for fever, malaise/fatigue and weight loss.  HENT: Negative.  Negative for congestion, ear discharge and ear pain.   Eyes:  Negative for pain, discharge and redness.  Respiratory:  Negative for cough, sputum production, shortness of breath and  wheezing.   Cardiovascular: Negative.  Negative for chest pain and palpitations.  Gastrointestinal:  Negative for abdominal pain and heartburn.  Skin:  Positive for itching and rash.  Neurological:  Negative for dizziness and headaches.  Endo/Heme/Allergies:  Negative for environmental allergies. Does not bruise/bleed easily.      Objective:   Blood pressure 118/76, pulse 75, temperature 97.7 F (36.5 C), resp. rate 16, height 5\' 7"  (1.702 m), weight 163 lb 2 oz (74 kg), SpO2 95 %. Body mass index is 25.55 kg/m.   Physical Exam:  Physical Exam Constitutional:      Appearance: She is well-developed.     Comments: Pleasant female. Cooperative with the exam.   HENT:     Head: Normocephalic and atraumatic.     Right Ear: Tympanic membrane, ear canal and external ear normal.     Left Ear: Tympanic membrane, ear canal and external ear normal.     Nose: No nasal deformity, septal deviation, mucosal edema or rhinorrhea.     Right Turbinates: Enlarged and swollen.     Left Turbinates: Swollen.     Right Sinus: No maxillary sinus tenderness or frontal sinus tenderness.     Left Sinus: No maxillary sinus tenderness or frontal sinus tenderness.     Comments: Scant clear rhinorrhea. No nasal polyps appreciated.     Mouth/Throat:     Mouth: Mucous membranes are not pale and not dry.     Pharynx: Uvula midline.     Comments: There is some cobblestoning present.  Eyes:     General:        Right eye: No discharge.        Left eye: No discharge.     Conjunctiva/sclera: Conjunctivae normal.     Right eye: Right conjunctiva is not injected. No chemosis.    Left eye: Left conjunctiva is not injected. No chemosis.    Pupils: Pupils are equal, round, and reactive to light.  Cardiovascular:     Rate and Rhythm: Normal rate and regular rhythm.     Heart sounds: Normal heart sounds.  Pulmonary:     Effort: Pulmonary effort is normal. No tachypnea, accessory muscle usage or respiratory  distress.     Breath sounds: Normal breath sounds. No wheezing, rhonchi or rales.  Chest:     Chest wall: No tenderness.  Lymphadenopathy:     Cervical: No cervical adenopathy.  Skin:    General: Skin is warm.     Capillary Refill: Capillary refill takes less than 2 seconds.     Findings: No abrasion, erythema, petechiae or rash. Rash is not papular, urticarial or vesicular.     Comments: She does have some raised eczematous patches in the midline around the occipital region of the skull.  Neurological:     Mental Status: She is alert.     Diagnostic  studies: none      Salvatore Marvel, MD  [facility] [place] [facility]

## 2021-12-04 NOTE — Patient Instructions (Addendum)
1. Seasonal and perennial allergic rhinitis (grasses, ragweed, weeds, trees, indoor molds, outdoor molds, dog and cockroach) - We will put you on a quicker build up for New Vials and space you to every 4 weeks.  - Continue with: Zyrtec (cetirizine) 5-10mg  as needed (but definitely on shot days)  2. Return in about 1 year (around 12/04/2022).    Please inform us of any Emergency Department visits, hospitalizations, or changes in symptoms. Call us before going to the ED for breathing or allergy symptoms since we might be able to fit you in for a sick visit. Feel free to contact us anytime with any questions, problems, or concerns.  It was a pleasure to see you again today!  Websites that have reliable patient information: 1. American Academy of Asthma, Allergy, and Immunology: www.aaaai.org 2. Food Allergy Research and Education (FARE): foodallergy.org 3. Mothers of Asthmatics: http://www.asthmacommunitynetwork.org 4. American College of Allergy, Asthma, and Immunology: www.acaai.org   COVID-19 Vaccine Information can be found at: [email or web address].[email or web address].uk For questions related to vaccine distribution or appointments, please email [email or web address] or call [phone].     Like [place] on [place] and Instagram for our latest updates!       Make sure you are registered to vote! If you have moved or changed any of your contact information, you will need to get this updated before voting!  In some cases, you MAY be able to register to vote online: [email or web address]

## 2021-12-04 NOTE — Telephone Encounter (Signed)
-----   Message from Alfonse Spruce, MD sent at 12/04/2021  1:04 PM EST ----- Can we change her to the [facility] Method and space her out to every 4 weeks a little bit early?  Therapy to be out of town a lot during [place] and that would be easier for them.  She was given be spaced in September anyway. Thx!

## 2021-12-07 ENCOUNTER — Ambulatory Visit: Payer: BC Managed Care – PPO | Admitting: Neurology

## 2021-12-07 ENCOUNTER — Other Ambulatory Visit: Payer: Self-pay

## 2021-12-07 DIAGNOSIS — G5132 Clonic hemifacial spasm, left: Secondary | ICD-10-CM

## 2021-12-07 NOTE — Procedures (Signed)
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  Risk discussed included, but were not limited pain and discomfort, bleeding, bruising, excessive weakness, venous thrombosis, muscle atrophy and dysphagia.  A copy of the patient medication guide was given to the patient which explains the blackbox warning.  Patients identity and treatment sites confirmed Yes.  .  Details of Procedure: Skin was cleaned with alcohol.   Prior to injection, the needle plunger was aspirated to make sure the needle was not within a blood vessel.  There was no blood retrieved on aspiration.    Following is a summary of the muscles injected  And the amount of Botulinum toxin used:  Injections  Location Left  Right Units Number of sites        Corrugator      Frontalis      Lower Lid, Lateral 2.5  2.5 1  Lower Lid Medial 2.5  2.5 1  Upper Lid, Lateral 2.5  2.5 1  Upper Lid, Medial 2.5  2.5 1  Canthus 2.5 2.5 5.0 1 each side  Temporalis      Masseter      Procerus      Zygomaticus Major      TOTAL UNITS:   15.0    Agent: Botulinum Type A ( Onobotulinum Toxin type A ).  1 vials of Botox were used, each containing 50 units and freshly diluted with 2 mL of sterile, non-perserved saline   Total injected (Units): 15.0  Total wasted (Units):85 Pt tolerated procedure well without complications.   Reinjection is anticipated in 3 months. 

## 2021-12-12 ENCOUNTER — Ambulatory Visit (INDEPENDENT_AMBULATORY_CARE_PROVIDER_SITE_OTHER): Payer: BC Managed Care – PPO

## 2021-12-12 DIAGNOSIS — J309 Allergic rhinitis, unspecified: Secondary | ICD-10-CM

## 2021-12-19 ENCOUNTER — Ambulatory Visit (INDEPENDENT_AMBULATORY_CARE_PROVIDER_SITE_OTHER): Payer: BC Managed Care – PPO

## 2021-12-19 DIAGNOSIS — J309 Allergic rhinitis, unspecified: Secondary | ICD-10-CM | POA: Diagnosis not present

## 2021-12-25 ENCOUNTER — Ambulatory Visit (INDEPENDENT_AMBULATORY_CARE_PROVIDER_SITE_OTHER): Payer: BC Managed Care – PPO

## 2021-12-25 DIAGNOSIS — J309 Allergic rhinitis, unspecified: Secondary | ICD-10-CM | POA: Diagnosis not present

## 2022-01-22 ENCOUNTER — Ambulatory Visit (INDEPENDENT_AMBULATORY_CARE_PROVIDER_SITE_OTHER): Payer: BC Managed Care – PPO

## 2022-01-22 DIAGNOSIS — J309 Allergic rhinitis, unspecified: Secondary | ICD-10-CM | POA: Diagnosis not present

## 2022-01-28 ENCOUNTER — Ambulatory Visit (INDEPENDENT_AMBULATORY_CARE_PROVIDER_SITE_OTHER): Payer: BC Managed Care – PPO | Admitting: *Deleted

## 2022-01-28 ENCOUNTER — Other Ambulatory Visit: Payer: Self-pay | Admitting: Neurology

## 2022-01-28 DIAGNOSIS — J309 Allergic rhinitis, unspecified: Secondary | ICD-10-CM

## 2022-01-28 DIAGNOSIS — G5132 Clonic hemifacial spasm, left: Secondary | ICD-10-CM

## 2022-02-04 ENCOUNTER — Telehealth: Payer: Self-pay | Admitting: Neurology

## 2022-02-04 NOTE — Telephone Encounter (Signed)
Patient called to let the office know effective 02/25/22 she will be starting on Medicare coverage through [facility].  ? ?Her Medicare ID # is: [id number] ? ?Patient has Botox on 03/08/22. ? ?Will a new PA need done? ?

## 2022-02-04 NOTE — Telephone Encounter (Signed)
Called patient Botox is on the way no need to do  ?

## 2022-02-08 ENCOUNTER — Telehealth: Payer: Self-pay

## 2022-02-08 ENCOUNTER — Ambulatory Visit (INDEPENDENT_AMBULATORY_CARE_PROVIDER_SITE_OTHER): Payer: BC Managed Care – PPO

## 2022-02-08 DIAGNOSIS — J309 Allergic rhinitis, unspecified: Secondary | ICD-10-CM | POA: Diagnosis not present

## 2022-02-08 NOTE — Telephone Encounter (Signed)
This is more of a billing question - I am routing to Vance Gather for more answers there.  ? ?We can also change her to the [facility] Method (0.1 - 0.3 - 0.5) advancing for new vials.  ? ?Salvatore Marvel, MD ?Allergy and [place] [facility] ? ?

## 2022-02-08 NOTE — Telephone Encounter (Signed)
Patient can in to the [place] office today for allergy shots. She would like to know her options for having both injections stay on the same schedule. With her current insurance she does not pay for her vials/ injections, but plans to switch to medicare soon. I advised her to speak with her insurance before wanting to discard a half full vial at the same time as her almost empty vial. She is usually gone during the summertime for vacation so she needs them both to be monthly for her to stay consistent.  ?

## 2022-02-20 NOTE — Telephone Encounter (Signed)
Thanks, Geralyn Flash! ?

## 2022-03-08 ENCOUNTER — Ambulatory Visit: Payer: Medicare PPO | Admitting: Neurology

## 2022-03-08 DIAGNOSIS — G5132 Clonic hemifacial spasm, left: Secondary | ICD-10-CM | POA: Diagnosis not present

## 2022-03-08 MED ORDER — ONABOTULINUMTOXINA 100 UNITS IJ SOLR
100.0000 [IU] | Freq: Once | INTRAMUSCULAR | Status: AC
  Administered 2022-03-08: 100 [IU] via INTRAMUSCULAR

## 2022-03-08 NOTE — Procedures (Signed)
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  Risk discussed included, but were not limited pain and discomfort, bleeding, bruising, excessive weakness, venous thrombosis, muscle atrophy and dysphagia.  A copy of the patient medication guide was given to the patient which explains the blackbox warning.  Patients identity and treatment sites confirmed Yes.  .  Details of Procedure: Skin was cleaned with alcohol.   Prior to injection, the needle plunger was aspirated to make sure the needle was not within a blood vessel.  There was no blood retrieved on aspiration.    Following is a summary of the muscles injected  And the amount of Botulinum toxin used:  Injections  Location Left  Right Units Number of sites        Corrugator      Frontalis      Lower Lid, Lateral 2.5  2.5 1  Lower Lid Medial 2.5  2.5 1  Upper Lid, Lateral 2.5  2.5 1  Upper Lid, Medial 2.5  2.5 1  Canthus 2.5 2.5 5.0 1 each side  Temporalis      Masseter      Procerus      Zygomaticus Major      TOTAL UNITS:   15.0    Agent: Botulinum Type A ( Onobotulinum Toxin type A ).  1 vials of Botox were used, each containing 50 units and freshly diluted with 2 mL of sterile, non-perserved saline   Total injected (Units): 15.0  Total wasted (Units):85 Pt tolerated procedure well without complications.   Reinjection is anticipated in 3 months. 

## 2022-03-20 ENCOUNTER — Ambulatory Visit (INDEPENDENT_AMBULATORY_CARE_PROVIDER_SITE_OTHER): Payer: Medicare PPO

## 2022-03-20 DIAGNOSIS — J309 Allergic rhinitis, unspecified: Secondary | ICD-10-CM | POA: Diagnosis not present

## 2022-04-08 ENCOUNTER — Ambulatory Visit (INDEPENDENT_AMBULATORY_CARE_PROVIDER_SITE_OTHER): Payer: Medicare PPO

## 2022-04-08 DIAGNOSIS — J309 Allergic rhinitis, unspecified: Secondary | ICD-10-CM

## 2022-04-14 ENCOUNTER — Other Ambulatory Visit: Payer: Self-pay | Admitting: Neurology

## 2022-04-14 DIAGNOSIS — G5132 Clonic hemifacial spasm, left: Secondary | ICD-10-CM

## 2022-04-17 ENCOUNTER — Telehealth (HOSPITAL_COMMUNITY): Payer: Self-pay | Admitting: Pharmacy Technician

## 2022-04-17 NOTE — Telephone Encounter (Signed)
Patient Advocate Encounter   Received notification that prior authorization for Botox 100UNIT solution is required.   PA submitted on 04/17/2022 Key [id number] Status is pending       Olivia Cole, CPhT Pharmacy Patient Advocate Specialist [facility] Patient Advocate Team Direct Number: [phone]  Fax: [phone]

## 2022-04-17 NOTE — Telephone Encounter (Signed)
Patient Advocate Encounter  Prior Authorization for Botox 100UNIT solution has been approved.    PA# [id number] Effective dates: 04/17/2022 through 10/27/2022      Roland Earl, CPhT Pharmacy Patient Advocate Specialist [facility] Patient Advocate Team Direct Number: [phone]  Fax: [phone]

## 2022-04-24 DIAGNOSIS — J3081 Allergic rhinitis due to animal (cat) (dog) hair and dander: Secondary | ICD-10-CM

## 2022-04-25 DIAGNOSIS — J301 Allergic rhinitis due to pollen: Secondary | ICD-10-CM | POA: Diagnosis not present

## 2022-05-28 ENCOUNTER — Ambulatory Visit (INDEPENDENT_AMBULATORY_CARE_PROVIDER_SITE_OTHER): Payer: Medicare PPO

## 2022-05-28 DIAGNOSIS — J309 Allergic rhinitis, unspecified: Secondary | ICD-10-CM

## 2022-06-19 ENCOUNTER — Ambulatory Visit (INDEPENDENT_AMBULATORY_CARE_PROVIDER_SITE_OTHER): Payer: Medicare PPO

## 2022-06-19 DIAGNOSIS — J309 Allergic rhinitis, unspecified: Secondary | ICD-10-CM

## 2022-06-21 ENCOUNTER — Ambulatory Visit: Payer: Medicare PPO | Admitting: Neurology

## 2022-06-21 DIAGNOSIS — G5132 Clonic hemifacial spasm, left: Secondary | ICD-10-CM

## 2022-06-21 MED ORDER — ONABOTULINUMTOXINA 100 UNITS IJ SOLR
100.0000 [IU] | Freq: Once | INTRAMUSCULAR | Status: AC
  Administered 2022-06-21: 25 [IU] via INTRAMUSCULAR

## 2022-06-21 NOTE — Procedures (Signed)
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  Changed pattern of injections to accommodate this today   Consent obtained from: The patient Benefits discussed included, but were not limited to decreased muscle tightness, increased joint range of motion, and decreased pain.  Risk discussed included, but were not limited pain and discomfort, bleeding, bruising, excessive weakness, venous thrombosis, muscle atrophy and dysphagia.  A copy of the patient medication guide was given to the patient which explains the blackbox warning.  Patients identity and treatment sites confirmed Yes.  .  Details of Procedure: Skin was cleaned with alcohol.   Prior to injection, the needle plunger was aspirated to make sure the needle was not within a blood vessel.  There was no blood retrieved on aspiration.    Following is a summary of the muscles injected  And the amount of Botulinum toxin used:  Injections  Location Left  Right Units Number of sites        Corrugator   2.5   Frontalis      Lower Lid, Lateral 2.5  2.5 1  Lower Lid Medial 2.5  2.5 1  Upper Lid, Lateral 2.5  2.5 1  Upper Lid, Medial 2.5  2.5 1  Canthus 2.5 2.5 5.0 1 each side  Temporalis      nasalis 2.5  2.5   Procerus      Zygomaticus Major 2.5/2.5  5.0   TOTAL UNITS:   25    Agent: Botulinum Type A ( Onobotulinum Toxin type A ).  1 vials of Botox were used, each containing 50 units and freshly diluted with 2 mL of sterile, non-perserved saline   Total injected (Units): 25  Total wasted (Units):75 Pt tolerated procedure well without complications.   Reinjection is anticipated in 3 months.

## 2022-07-03 ENCOUNTER — Other Ambulatory Visit: Payer: Self-pay | Admitting: Neurology

## 2022-07-03 DIAGNOSIS — G5132 Clonic hemifacial spasm, left: Secondary | ICD-10-CM

## 2022-07-08 ENCOUNTER — Other Ambulatory Visit: Payer: Self-pay

## 2022-07-08 DIAGNOSIS — G5132 Clonic hemifacial spasm, left: Secondary | ICD-10-CM

## 2022-07-08 MED ORDER — BOTOX 100 UNITS IJ SOLR: INTRAMUSCULAR | 2 refills | Status: DC

## 2022-07-10 ENCOUNTER — Other Ambulatory Visit: Payer: Self-pay | Admitting: Neurology

## 2022-07-10 DIAGNOSIS — G5132 Clonic hemifacial spasm, left: Secondary | ICD-10-CM

## 2022-07-11 ENCOUNTER — Other Ambulatory Visit: Payer: Self-pay

## 2022-07-11 ENCOUNTER — Telehealth: Payer: Self-pay | Admitting: Neurology

## 2022-07-11 DIAGNOSIS — G5132 Clonic hemifacial spasm, left: Secondary | ICD-10-CM

## 2022-07-11 MED ORDER — BOTOX 100 UNITS IJ SOLR: INTRAMUSCULAR | 2 refills | Status: DC

## 2022-07-11 NOTE — Telephone Encounter (Signed)
Called patient to verify this is the pharmacy her insurance wants [place] to send it to. She did confirm and I have sent in the prescription for Botox to [facility] in [place] [phone] # [phone]

## 2022-07-11 NOTE — Telephone Encounter (Signed)
Hallah from [facility] in [id number] called to report [facility] will not be handling the Rx for Botox.  They will need a new Rx sent into the pharmacy [facility] in [place] [phone].

## 2022-07-18 ENCOUNTER — Ambulatory Visit (INDEPENDENT_AMBULATORY_CARE_PROVIDER_SITE_OTHER): Payer: Medicare PPO

## 2022-07-18 DIAGNOSIS — J309 Allergic rhinitis, unspecified: Secondary | ICD-10-CM

## 2022-07-26 ENCOUNTER — Ambulatory Visit (INDEPENDENT_AMBULATORY_CARE_PROVIDER_SITE_OTHER): Payer: Medicare PPO | Admitting: *Deleted

## 2022-07-26 DIAGNOSIS — J309 Allergic rhinitis, unspecified: Secondary | ICD-10-CM

## 2022-08-05 ENCOUNTER — Ambulatory Visit (INDEPENDENT_AMBULATORY_CARE_PROVIDER_SITE_OTHER): Payer: Medicare PPO

## 2022-08-05 DIAGNOSIS — J309 Allergic rhinitis, unspecified: Secondary | ICD-10-CM

## 2022-09-02 ENCOUNTER — Ambulatory Visit (INDEPENDENT_AMBULATORY_CARE_PROVIDER_SITE_OTHER): Payer: Medicare PPO

## 2022-09-02 DIAGNOSIS — J309 Allergic rhinitis, unspecified: Secondary | ICD-10-CM | POA: Diagnosis not present

## 2022-09-12 ENCOUNTER — Telehealth: Payer: Self-pay | Admitting: Neurology

## 2022-09-12 NOTE — Telephone Encounter (Signed)
Patient is calling to let us know that [facility] pharmacy has called twice about her Botox and is needs [place] to call them back

## 2022-09-12 NOTE — Telephone Encounter (Signed)
Called and have it scheduled to be delivered on 09-17-22

## 2022-09-27 ENCOUNTER — Ambulatory Visit: Payer: Medicare PPO | Admitting: Neurology

## 2022-09-27 DIAGNOSIS — G5132 Clonic hemifacial spasm, left: Secondary | ICD-10-CM

## 2022-09-27 MED ORDER — ONABOTULINUMTOXINA 100 UNITS IJ SOLR
100.0000 [IU] | Freq: Once | INTRAMUSCULAR | Status: AC
  Administered 2022-09-27: 25 [IU] via INTRAMUSCULAR

## 2022-09-27 NOTE — Procedures (Signed)
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  Risk discussed included, but were not limited pain and discomfort, bleeding, bruising, excessive weakness, venous thrombosis, muscle atrophy and dysphagia.  A copy of the patient medication guide was given to the patient which explains the blackbox warning.  Patients identity and treatment sites confirmed Yes.  .  Details of Procedure: Skin was cleaned with alcohol.   Prior to injection, the needle plunger was aspirated to make sure the needle was not within a blood vessel.  There was no blood retrieved on aspiration.    Following is a summary of the muscles injected  And the amount of Botulinum toxin used:  Injections  Location Left  Right Units Number of sites        Corrugator   2.5   Frontalis      Lower Lid, Lateral 2.5  2.5 1  Lower Lid Medial 2.5  2.5 1  Upper Lid, Lateral 2.5  2.5 1  Upper Lid, Medial 2.5  2.5 1  Canthus 2.5 2.5 5.0 1 each side  Temporalis      nasalis 2.5  2.5   Procerus      Zygomaticus Major 2.5/2.5  5.0   TOTAL UNITS:   25    Agent: Botulinum Type A ( Onobotulinum Toxin type A ).  1 vials of Botox were used, each containing 50 units and freshly diluted with 2 mL of sterile, non-perserved saline   Total injected (Units): 25  Total wasted (Units):75 Pt tolerated procedure well without complications.   Reinjection is anticipated in 3 months.

## 2022-10-02 ENCOUNTER — Ambulatory Visit (INDEPENDENT_AMBULATORY_CARE_PROVIDER_SITE_OTHER): Payer: Medicare PPO

## 2022-10-02 DIAGNOSIS — J309 Allergic rhinitis, unspecified: Secondary | ICD-10-CM | POA: Diagnosis not present

## 2022-11-01 ENCOUNTER — Ambulatory Visit (INDEPENDENT_AMBULATORY_CARE_PROVIDER_SITE_OTHER): Payer: Medicare PPO

## 2022-11-01 DIAGNOSIS — J309 Allergic rhinitis, unspecified: Secondary | ICD-10-CM

## 2022-12-03 ENCOUNTER — Encounter: Payer: Self-pay | Admitting: Allergy & Immunology

## 2022-12-03 ENCOUNTER — Ambulatory Visit: Payer: Medicare PPO | Admitting: Allergy & Immunology

## 2022-12-03 ENCOUNTER — Other Ambulatory Visit: Payer: Self-pay

## 2022-12-03 ENCOUNTER — Ambulatory Visit: Payer: Self-pay

## 2022-12-03 VITALS — BP 122/78 | HR 78 | Temp 98.5°F | Resp 18 | Ht 67.0 in | Wt 169.6 lb

## 2022-12-03 DIAGNOSIS — J302 Other seasonal allergic rhinitis: Secondary | ICD-10-CM

## 2022-12-03 DIAGNOSIS — J309 Allergic rhinitis, unspecified: Secondary | ICD-10-CM

## 2022-12-03 DIAGNOSIS — L2089 Other atopic dermatitis: Secondary | ICD-10-CM | POA: Diagnosis not present

## 2022-12-03 NOTE — Progress Notes (Signed)
FOLLOW UP  Date of Service/Encounter:  12/03/22   Assessment:   Seasonal and perennial allergic rhinitis (grasses, ragweed, weeds, trees, indoor molds, outdoor molds, dog and cockroach) - with maintenance reached March 2021   Large local reactions - resolved   Eczema - on the scalp    Plan/Recommendations:   1. Seasonal and perennial allergic rhinitis (grasses, ragweed, weeds, trees, indoor molds, outdoor molds, dog and cockroach) - We will put you on a quicker build up for New Vials and space you to every 4 weeks.  - We are going to increase dog and the grasses in your next set of vials.  - Continue with: Zyrtec (cetirizine) 5-10mg  as needed (but definitely on shot days)  2. Return in about 1 year (around 12/04/2023).   Subjective:   Olivia Cole is a 67 y.o. female presenting today for follow up of  Chief Complaint  Patient presents with   Seasonal and perennial allergic rhinitis   Follow-up   Nasal Congestion    Olivia Cole has a history of the following: Patient Active Problem List   Diagnosis Date Noted   Seasonal and perennial allergic rhinitis 11/21/2020   Facial spasm 08/02/2015   Herniated lumbar intervertebral disc 09/01/2014    History obtained from: chart review and patient.  Olivia Cole is a 67 y.o. female presenting for a follow up visit.  She was last seen in February 2023.  At that time, she was doing well with her allergen immunotherapy.  We continued Zyrtec 5 to 10 mg as needed on shot days.  For her eczema, refilled her triamcinolone and started clobetasol shampoo.  Since last visit, she has done very well.   Allergic Rhinitis Symptom History: Allergic rhinitis symptoms are under fair control with the use of the allergy shots. She definitely feels much better now compared to have she felt prior to starting the allergy shots.   Olivia Cole is on allergen immunotherapy. She receives two injections. Immunotherapy script #1 contains trees, weeds, grasses  and dog. She currently receives 0.40mL of the RED vial (1/100). Immunotherapy script #2 contains ragweed, molds and cockroach. She currently receives 0.23mL of the RED vial (1/100). She started shots November of 2020 and reached maintenance in March 2021.    She has not had large local reactions. She has not had sinus infections. She is not using a nose spray on a routine basis. Worse time of the year is hard to tell. It was spring definitely, but now it is sometimes every season for a short period of time.   She thinks that the grass is her biggest trigger. She has a dog that sleeps with them. He is a Holiday representative and does sleep with them.  They were thinking of moving to [place]. She has always wanted to live there. Her daughter [place] having a baby is [place] next month. They are staying for one month. She juts moved to [place] where her husband lived. She speaks [place].Olivia Cole is the first grandchild and everyone seems super excited about it.   She is retired August 2022. She is not sure what she is going to do in her second career. 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 She is not moving to her cabin full time.    Her husband is doing civil rights work. She did some work with [employer], but she has not done as much with this since the pandemic. He is writing an autobiography now of his work with police reform.  Otherwise, there have been no changes to her past medical history, surgical history, family history, or social history.    Review of Systems  Constitutional: Negative.  Negative for fever, malaise/fatigue and weight loss.  HENT: Negative.  Negative for congestion, ear discharge and ear pain.   Eyes:  Negative for pain, discharge and redness.  Respiratory:  Negative for cough, sputum production, shortness of breath and wheezing.   Cardiovascular: Negative.  Negative for chest pain and palpitations.  Gastrointestinal:  Negative for abdominal pain and heartburn.   Skin:  Positive for itching and rash.  Neurological:  Negative for dizziness and headaches.  Endo/Heme/Allergies:  Negative for environmental allergies. Does not bruise/bleed easily.       Objective:   Blood pressure 122/78, pulse 78, temperature 98.5 F (36.9 C), temperature source Temporal, resp. rate 18, height 5\' 7"  (1.702 m), weight 169 lb 9.6 oz (76.9 kg), SpO2 96 %. Body mass index is 26.56 kg/m.    Physical Exam Vitals reviewed.  Constitutional:      Appearance: She is well-developed.     Comments: Pleasant female. Cooperative with the exam.   HENT:     Head: Normocephalic and atraumatic.     Right Ear: Tympanic membrane, ear canal and external ear normal.     Left Ear: Tympanic membrane, ear canal and external ear normal.     Nose: No nasal deformity, septal deviation, mucosal edema or rhinorrhea.     Right Turbinates: Enlarged, swollen and pale.     Left Turbinates: Enlarged, swollen and pale.     Right Sinus: No maxillary sinus tenderness or frontal sinus tenderness.     Left Sinus: No maxillary sinus tenderness or frontal sinus tenderness.     Comments: Scant clear rhinorrhea. No nasal polyps appreciated.     Mouth/Throat:     Mouth: Mucous membranes are not pale and not dry.     Pharynx: Uvula midline.     Comments: There is some cobblestoning present.  Eyes:     General:        Right eye: No discharge.        Left eye: No discharge.     Conjunctiva/sclera: Conjunctivae normal.     Right eye: Right conjunctiva is not injected. No chemosis.    Left eye: Left conjunctiva is not injected. No chemosis.    Pupils: Pupils are equal, round, and reactive to light.  Cardiovascular:     Rate and Rhythm: Normal rate and regular rhythm.     Heart sounds: Normal heart sounds.  Pulmonary:     Effort: Pulmonary effort is normal. No tachypnea, accessory muscle usage or respiratory distress.     Breath sounds: Normal breath sounds. No wheezing, rhonchi or rales.  Chest:      Chest wall: No tenderness.  Lymphadenopathy:     Cervical: No cervical adenopathy.  Skin:    General: Skin is warm.     Capillary Refill: Capillary refill takes less than 2 seconds.     Findings: No abrasion, erythema, petechiae or rash. Rash is not papular, urticarial or vesicular.     Comments: She does have some raised eczematous patches in the midline around the occipital region of the skull.  Neurological:     Mental Status: She is alert.  Psychiatric:        Behavior: Behavior is cooperative.      Diagnostic studies: none       Salvatore Marvel, MD  [facility]

## 2022-12-03 NOTE — Addendum Note (Signed)
Addended by: Valentina Shaggy on: 12/03/2022 03:58 PM   Modules accepted: Orders

## 2022-12-03 NOTE — Patient Instructions (Addendum)
1. Seasonal and perennial allergic rhinitis (grasses, ragweed, weeds, trees, indoor molds, outdoor molds, dog and cockroach) - We will put you on a quicker build up for New Vials and space you to every 4 weeks.  - We are going to increase dog and the grasses in your next set of vials.  - Continue with: Zyrtec (cetirizine) 5-10mg  as needed (but definitely on shot days)  2. Return in about 1 year (around 12/04/2023).    Please inform us of any Emergency Department visits, hospitalizations, or changes in symptoms. Call us before going to the ED for breathing or allergy symptoms since we might be able to fit you in for a sick visit. Feel free to contact us anytime with any questions, problems, or concerns.  It was a pleasure to see you again today!  Websites that have reliable patient information: 1. American Academy of Asthma, Allergy, and Immunology: www.aaaai.org 2. Food Allergy Research and Education (FARE): foodallergy.org 3. Mothers of Asthmatics: http://www.asthmacommunitynetwork.org 4. American College of Allergy, Asthma, and Immunology: www.acaai.org   COVID-19 Vaccine Information can be found at: [email or web address].co.uk For questions related to vaccine distribution or appointments, please email [email or web address] or call [phone].     "Like" [place] on Facebook and Instagram for our latest updates!       Make sure you are registered to vote! If you have moved or changed any of your contact information, you will need to get this updated before voting!  In some cases, you MAY be able to register to vote online: [email or web address]

## 2022-12-04 NOTE — Progress Notes (Signed)
Aeroallergen Immunotherapy (**NOTE NEW SCRIPT**)  Ordering Provider: Dr. Salvatore Marvel  Patient Details Name: Olivia Cole MRN: [id number] Date of Birth: 1956/04/09  Order 1 of 2  Vial Label: G/W/T/D  0.4 ml (Volume)  BAU Concentration -- 7 Grass Mix* 100,000 ([address] [place], [place], [place], [place], Red[place], Sweet Vernal, Timothy) 0.4 ml (Volume)  BAU Concentration -- [place] 10,000 0.2 ml (Volume)  1:20 Concentration -- Johnson 0.2 ml (Volume)  1:10 Concentration -- Plantain English 0.2 ml (Volume)  1:20 Concentration -- Rough Pigweed* 0.2 ml (Volume)  1:20 Concentration -- Mugwort, Common* 0.3 ml (Volume)  1:20 Concentration -- Ash mix* 0.3 ml (Volume)  1:10 Concentration -- Birch mix* 0.3 ml (Volume)  1:10 Concentration -- Pecan Pollen 0.8 ml (Volume)  1:10 Concentration -- Dog Epithelia    3.3  ml Extract Subtotal 1.7  ml Diluent 5.0  ml Maintenance Total  Schedule:  C  Red Vial (1:100): Schedule C (5 doses)  Special Instructions: None

## 2022-12-17 ENCOUNTER — Encounter: Payer: Self-pay | Admitting: Neurology

## 2022-12-23 ENCOUNTER — Telehealth: Payer: Self-pay

## 2022-12-23 ENCOUNTER — Other Ambulatory Visit (HOSPITAL_COMMUNITY): Payer: Self-pay

## 2022-12-23 ENCOUNTER — Telehealth: Payer: Self-pay | Admitting: Pharmacy Technician

## 2022-12-23 NOTE — Telephone Encounter (Signed)
BotoxOne verification has been submitted. Benefit Verification:   [place] PA has been submitted for BOTOX 100U via CMM. INSURANCE: [employer] DATE SUBMITTED: 2.26.24 KEY: [id number] Status is pending

## 2022-12-23 NOTE — Telephone Encounter (Signed)
Received a voicemail that the patients botox requires a prior authorization, wondering if we can get this started for the patient.

## 2022-12-23 NOTE — Telephone Encounter (Signed)
Pharmacy Patient Advocate Encounter- Botox BIV-Pharmacy Benefit:  PA was submitted to [facility] and has been approved through: [facility] Authorization# [id number]  Please send prescription to Specialty Pharmacy: [place]: [phone] Estimated Copay is: [phone]

## 2022-12-24 ENCOUNTER — Ambulatory Visit (INDEPENDENT_AMBULATORY_CARE_PROVIDER_SITE_OTHER): Payer: Medicare PPO

## 2022-12-24 DIAGNOSIS — J309 Allergic rhinitis, unspecified: Secondary | ICD-10-CM

## 2022-12-25 ENCOUNTER — Other Ambulatory Visit: Payer: Self-pay

## 2022-12-25 DIAGNOSIS — G5132 Clonic hemifacial spasm, left: Secondary | ICD-10-CM

## 2022-12-25 MED ORDER — BOTOX 100 UNITS IJ SOLR: INTRAMUSCULAR | 2 refills | Status: DC

## 2022-12-25 NOTE — Telephone Encounter (Signed)
Prescription has been sent to [facility]

## 2022-12-27 ENCOUNTER — Ambulatory Visit: Payer: Medicare PPO | Admitting: Neurology

## 2023-01-10 ENCOUNTER — Ambulatory Visit: Payer: Medicare PPO | Admitting: Neurology

## 2023-01-10 NOTE — Telephone Encounter (Signed)
PA Approval documented in other encounter

## 2023-01-13 ENCOUNTER — Ambulatory Visit (INDEPENDENT_AMBULATORY_CARE_PROVIDER_SITE_OTHER): Payer: Medicare PPO | Admitting: *Deleted

## 2023-01-13 DIAGNOSIS — J309 Allergic rhinitis, unspecified: Secondary | ICD-10-CM

## 2023-01-20 ENCOUNTER — Telehealth: Payer: Self-pay

## 2023-01-20 NOTE — Telephone Encounter (Signed)
Called back and left message that patients Botox is here in the office

## 2023-01-20 NOTE — Telephone Encounter (Signed)
[facility] is wanting to clarify that we have last months botox medication from when patient missed her appointment.

## 2023-01-21 DIAGNOSIS — J3081 Allergic rhinitis due to animal (cat) (dog) hair and dander: Secondary | ICD-10-CM | POA: Diagnosis not present

## 2023-01-21 NOTE — Progress Notes (Signed)
VIALS EXP 01-21-24.  G-W-T-D NEW RX

## 2023-01-22 DIAGNOSIS — J302 Other seasonal allergic rhinitis: Secondary | ICD-10-CM | POA: Diagnosis not present

## 2023-02-20 ENCOUNTER — Ambulatory Visit (INDEPENDENT_AMBULATORY_CARE_PROVIDER_SITE_OTHER): Payer: Medicare PPO

## 2023-02-20 DIAGNOSIS — J309 Allergic rhinitis, unspecified: Secondary | ICD-10-CM

## 2023-02-21 ENCOUNTER — Other Ambulatory Visit: Payer: Self-pay

## 2023-02-21 ENCOUNTER — Ambulatory Visit: Payer: Medicare PPO | Admitting: Neurology

## 2023-02-21 DIAGNOSIS — G5132 Clonic hemifacial spasm, left: Secondary | ICD-10-CM

## 2023-02-21 MED ORDER — ONABOTULINUMTOXINA 100 UNITS IJ SOLR
100.0000 [IU] | Freq: Once | INTRAMUSCULAR | Status: AC
  Administered 2023-02-21: 25 [IU] via INTRAMUSCULAR

## 2023-02-21 NOTE — Procedures (Signed)
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   Consent obtained from: The patient Benefits discussed included, but were not limited to decreased muscle tightness, increased joint range of motion, and decreased pain.  Risk discussed included, but were not limited pain and discomfort, bleeding, bruising, excessive weakness, venous thrombosis, muscle atrophy and dysphagia.  A copy of the patient medication guide was given to the patient which explains the blackbox warning.  Patients identity and treatment sites confirmed Yes.  .  Details of Procedure: Skin was cleaned with alcohol.   Prior to injection, the needle plunger was aspirated to make sure the needle was not within a blood vessel.  There was no blood retrieved on aspiration.    Following is a summary of the muscles injected  And the amount of Botulinum toxin used:  Injections  Location Left  Right Units Number of sites        Corrugator      Frontalis      Lower Lid, Lateral 2.5  2.5 1  Lower Lid Medial 2.5  2.5 1  Upper Lid, Lateral 2.5  2.5 1  Upper Lid, Medial 2.5  2.5 1  Canthus 2.5 2.5 5.0 1 each side  Temporalis      nasalis      Procerus   5.0   Zygomaticus Major 2.5/2.5  5.0   TOTAL UNITS:   25    Agent: Botulinum Type A ( Onobotulinum Toxin type A ).  1 vials of Botox were used, each containing 50 units and freshly diluted with 2 mL of sterile, non-perserved saline   Total injected (Units): 25  Total wasted (Units):75 Pt tolerated procedure well without complications.   Reinjection is anticipated in 3 months.

## 2023-03-25 DIAGNOSIS — S61432A Puncture wound without foreign body of left hand, initial encounter: Secondary | ICD-10-CM | POA: Diagnosis not present

## 2023-03-31 ENCOUNTER — Ambulatory Visit (INDEPENDENT_AMBULATORY_CARE_PROVIDER_SITE_OTHER): Payer: Medicare PPO | Admitting: *Deleted

## 2023-03-31 DIAGNOSIS — J309 Allergic rhinitis, unspecified: Secondary | ICD-10-CM | POA: Diagnosis not present

## 2023-04-11 ENCOUNTER — Ambulatory Visit: Payer: Medicare PPO | Admitting: Neurology

## 2023-04-28 ENCOUNTER — Ambulatory Visit (INDEPENDENT_AMBULATORY_CARE_PROVIDER_SITE_OTHER): Payer: Medicare PPO

## 2023-04-28 DIAGNOSIS — J309 Allergic rhinitis, unspecified: Secondary | ICD-10-CM | POA: Diagnosis not present

## 2023-05-05 ENCOUNTER — Ambulatory Visit (INDEPENDENT_AMBULATORY_CARE_PROVIDER_SITE_OTHER): Payer: Medicare PPO | Admitting: *Deleted

## 2023-05-05 DIAGNOSIS — J309 Allergic rhinitis, unspecified: Secondary | ICD-10-CM | POA: Diagnosis not present

## 2023-05-05 DIAGNOSIS — Z1231 Encounter for screening mammogram for malignant neoplasm of breast: Secondary | ICD-10-CM | POA: Diagnosis not present

## 2023-05-13 ENCOUNTER — Ambulatory Visit (INDEPENDENT_AMBULATORY_CARE_PROVIDER_SITE_OTHER): Payer: Medicare PPO

## 2023-05-13 DIAGNOSIS — J309 Allergic rhinitis, unspecified: Secondary | ICD-10-CM

## 2023-05-23 ENCOUNTER — Ambulatory Visit: Payer: Medicare PPO | Admitting: Neurology

## 2023-06-10 ENCOUNTER — Ambulatory Visit (INDEPENDENT_AMBULATORY_CARE_PROVIDER_SITE_OTHER): Payer: Medicare PPO | Admitting: *Deleted

## 2023-06-10 DIAGNOSIS — J309 Allergic rhinitis, unspecified: Secondary | ICD-10-CM | POA: Diagnosis not present

## 2023-06-27 ENCOUNTER — Ambulatory Visit: Payer: Medicare PPO | Admitting: Neurology

## 2023-06-27 DIAGNOSIS — G5132 Clonic hemifacial spasm, left: Secondary | ICD-10-CM | POA: Diagnosis not present

## 2023-06-27 MED ORDER — ONABOTULINUMTOXINA 100 UNITS IJ SOLR
100.0000 [IU] | Freq: Once | INTRAMUSCULAR | Status: AC
  Administered 2023-06-27: 25 [IU] via INTRAMUSCULAR

## 2023-06-27 NOTE — Procedures (Signed)
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  Risk discussed included, but were not limited pain and discomfort, bleeding, bruising, excessive weakness, venous thrombosis, muscle atrophy and dysphagia.  A copy of the patient medication guide was given to the patient which explains the blackbox warning.  Patients identity and treatment sites confirmed Yes.  .  Details of Procedure: Skin was cleaned with alcohol.   Prior to injection, the needle plunger was aspirated to make sure the needle was not within a blood vessel.  There was no blood retrieved on aspiration.    Following is a summary of the muscles injected  And the amount of Botulinum toxin used:  Injections  Location Left  Right Units Number of sites        Corrugator      Frontalis      Lower Lid, Lateral 2.5  2.5 1  Lower Lid Medial 2.5  2.5 1  Upper Lid, Lateral 2.5  2.5 1  Upper Lid, Medial 2.5  2.5 1  Canthus 2.5 2.5 5.0 1 each side  Temporalis      nasalis      Procerus   5.0   Zygomaticus Major 2.5/2.5  5.0   TOTAL UNITS:   25    Agent: Botulinum Type A ( Onobotulinum Toxin type A ).  1 vials of Botox were used, each containing 50 units and freshly diluted with 2 mL of sterile, non-perserved saline   Total injected (Units): 25  Total wasted (Units):75 Pt tolerated procedure well without complications.   Reinjection is anticipated in 3 months.

## 2023-07-10 ENCOUNTER — Ambulatory Visit (INDEPENDENT_AMBULATORY_CARE_PROVIDER_SITE_OTHER): Payer: Medicare PPO

## 2023-07-10 DIAGNOSIS — J309 Allergic rhinitis, unspecified: Secondary | ICD-10-CM | POA: Diagnosis not present

## 2023-07-16 DIAGNOSIS — H2513 Age-related nuclear cataract, bilateral: Secondary | ICD-10-CM | POA: Diagnosis not present

## 2023-07-16 DIAGNOSIS — H43813 Vitreous degeneration, bilateral: Secondary | ICD-10-CM | POA: Diagnosis not present

## 2023-07-16 DIAGNOSIS — H35341 Macular cyst, hole, or pseudohole, right eye: Secondary | ICD-10-CM | POA: Diagnosis not present

## 2023-07-16 DIAGNOSIS — H35373 Puckering of macula, bilateral: Secondary | ICD-10-CM | POA: Diagnosis not present

## 2023-08-06 ENCOUNTER — Ambulatory Visit (INDEPENDENT_AMBULATORY_CARE_PROVIDER_SITE_OTHER): Payer: Medicare PPO | Admitting: *Deleted

## 2023-08-06 DIAGNOSIS — J309 Allergic rhinitis, unspecified: Secondary | ICD-10-CM

## 2023-09-10 ENCOUNTER — Ambulatory Visit (INDEPENDENT_AMBULATORY_CARE_PROVIDER_SITE_OTHER): Payer: Medicare PPO | Admitting: *Deleted

## 2023-09-10 DIAGNOSIS — J309 Allergic rhinitis, unspecified: Secondary | ICD-10-CM

## 2023-09-17 ENCOUNTER — Other Ambulatory Visit: Payer: Self-pay | Admitting: Neurology

## 2023-09-17 DIAGNOSIS — G5132 Clonic hemifacial spasm, left: Secondary | ICD-10-CM

## 2023-10-03 ENCOUNTER — Ambulatory Visit: Payer: Medicare PPO | Admitting: Neurology

## 2023-10-03 DIAGNOSIS — G5132 Clonic hemifacial spasm, left: Secondary | ICD-10-CM | POA: Diagnosis not present

## 2023-10-03 MED ORDER — ONABOTULINUMTOXINA 100 UNITS IJ SOLR
100.0000 [IU] | Freq: Once | INTRAMUSCULAR | Status: AC
  Administered 2023-10-03: 20 [IU] via INTRAMUSCULAR

## 2023-10-03 NOTE — Procedures (Signed)
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  Changed pattern this time   Consent obtained from: The patient Benefits discussed included, but were not limited to decreased muscle tightness, increased joint range of motion, and decreased pain.  Risk discussed included, but were not limited pain and discomfort, bleeding, bruising, excessive weakness, venous thrombosis, muscle atrophy and dysphagia.  A copy of the patient medication guide was given to the patient which explains the blackbox warning.  Patients identity and treatment sites confirmed Yes.  .  Details of Procedure: Skin was cleaned with alcohol.   Prior to injection, the needle plunger was aspirated to make sure the needle was not within a blood vessel.  There was no blood retrieved on aspiration.    Following is a summary of the muscles injected  And the amount of Botulinum toxin used:  Injections  Location Left  Right Units Number of sites        Corrugator      Frontalis      Lower Lid, Lateral 2.5  2.5 1  Lower Lid Medial      Upper Lid, Lateral 2.5  2.5 1  Upper Lid, Medial      Canthus 2.5 2.5 5.0 1 each side  Temporalis      nasalis      Procerus   5.0   Zygomaticus Major 2.5/2.5  5.0   TOTAL UNITS:   20    Agent: Botulinum Type A ( Onobotulinum Toxin type A ).  1 vials of Botox were used, each containing 50 units and freshly diluted with 2 mL of sterile, non-perserved saline   Total injected (Units): 20  Total wasted (Units):80 Pt tolerated procedure well without complications.   Reinjection is anticipated in 3 months.

## 2023-10-09 ENCOUNTER — Other Ambulatory Visit: Payer: Self-pay

## 2023-10-09 ENCOUNTER — Ambulatory Visit: Payer: Medicare PPO | Admitting: Allergy & Immunology

## 2023-10-09 ENCOUNTER — Encounter: Payer: Self-pay | Admitting: Allergy & Immunology

## 2023-10-09 VITALS — BP 126/76 | HR 64 | Temp 98.0°F | Resp 18 | Ht 66.14 in | Wt 162.8 lb

## 2023-10-09 DIAGNOSIS — L2089 Other atopic dermatitis: Secondary | ICD-10-CM

## 2023-10-09 DIAGNOSIS — J302 Other seasonal allergic rhinitis: Secondary | ICD-10-CM

## 2023-10-09 DIAGNOSIS — J3089 Other allergic rhinitis: Secondary | ICD-10-CM

## 2023-10-09 DIAGNOSIS — J309 Allergic rhinitis, unspecified: Secondary | ICD-10-CM

## 2023-10-09 NOTE — Progress Notes (Signed)
FOLLOW UP  Date of Service/Encounter:  10/09/23   Assessment:   Seasonal and perennial allergic rhinitis (grasses, ragweed, weeds, trees, indoor molds, outdoor molds, dog and cockroach) - with maintenance reached March 2021   Large local reactions - resolved   Eczema - on the scalp  Plan/Recommendations:   1. Seasonal and perennial allergic rhinitis (grasses, ragweed, weeds, trees, indoor molds, outdoor molds, dog and cockroach) - I am checking with our billing team to ensure that we are definitely out of network.  - I will text you with an update. - Continue with: Zyrtec (cetirizine) 5-10mg  as needed or Allegra on shot days.   2. Return in about 1 year (around 10/08/2024).    Subjective:   Olivia Cole is a 67 y.o. female presenting today for follow up of  Chief Complaint  Patient presents with   Allergic Rhinitis     Patient's insurance is changing and next year our office will be out of network. Would like to see if she is good to discontinue shots.     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  Discussed the use of AI scribe software for clinical note transcription with the patient and/or guardian, who gave verbal consent to proceed.  Olivia Cole is a 67 y.o. female presenting for a follow up visit.  We last saw her in February 2024.  At that time, we put her on a quicker buildup for new vials and spaced her to every 4 weeks.  We increased dog and grass in the next set of vials.  We continue with Zyrtec 5 to 10 mg as needed.  Since the last visit, she has done fairly well.  Olivia Cole has been undergoing immunotherapy and has reached maintenance in March 2021. She reports occasional congestion, for which they take half a dose of  Zyrtec at night as needed. She will also take Allegra on shot days. She reports minor local reactions to the shots. She has not experienced any significant allergic reactions or sinus infections since starting therapy. Olivia Cole is considering changing her insurance plan, which may affect their ability to continue treatment with [place]. She has checked and we are listed out of network. She is contemplating discontinuing the shots to observe her response.    Olivia Cole is on allergen immunotherapy. She receives two injections. Immunotherapy script #1 contains trees, weeds, grasses and dog. She currently receives 0.91mL of the RED vial (1/100). Immunotherapy script #2 contains ragweed, molds and cockroach. She currently receives 0.22mL of the RED vial (1/100). She started shots November of 2020 and reached maintenance in March 2021.    Otherwise, there have been no changes to her past medical history, surgical history, family history, or social history.    Review of systems otherwise negative other than that mentioned in the HPI.    Objective:   Blood pressure 126/76, pulse 64, temperature 98 F (36.7 C), resp. rate 18, height 5' 6.14" (1.68 m), weight 162 lb 12.8 oz (73.8 kg). Body mass index is 26.16 kg/m.    Physical Exam Vitals reviewed.  Constitutional:      Appearance: She is well-developed.     Comments: Pleasant female. Cooperative with the exam. Talkative.   HENT:     Head: Normocephalic and atraumatic.  Right Ear: Tympanic membrane, ear canal and external ear normal.     Left Ear: Tympanic membrane, ear canal and external ear normal.     Nose: No nasal deformity, septal deviation, mucosal edema or rhinorrhea.     Right Turbinates: Enlarged, swollen and pale.     Left Turbinates: Enlarged, swollen and pale.     Right Sinus: No maxillary sinus tenderness or frontal sinus tenderness.     Left Sinus: No maxillary sinus tenderness or frontal sinus tenderness.     Comments: Scant clear  rhinorrhea.    Mouth/Throat:     Mouth: Mucous membranes are not pale and not dry.     Pharynx: Uvula midline.     Comments: There is some cobblestoning present.  Eyes:     General:        Right eye: No discharge.        Left eye: No discharge.     Conjunctiva/sclera: Conjunctivae normal.     Right eye: Right conjunctiva is not injected. No chemosis.    Left eye: Left conjunctiva is not injected. No chemosis.    Pupils: Pupils are equal, round, and reactive to light.  Cardiovascular:     Rate and Rhythm: Normal rate and regular rhythm.     Heart sounds: Normal heart sounds.  Pulmonary:     Effort: Pulmonary effort is normal. No tachypnea, accessory muscle usage or respiratory distress.     Breath sounds: Normal breath sounds. No wheezing, rhonchi or rales.  Chest:     Chest wall: No tenderness.  Lymphadenopathy:     Cervical: No cervical adenopathy.  Skin:    General: Skin is warm.     Capillary Refill: Capillary refill takes less than 2 seconds.     Findings: No abrasion, erythema, petechiae or rash. Rash is not papular, urticarial or vesicular.  Neurological:     Mental Status: She is alert.  Psychiatric:        Behavior: Behavior is cooperative.      Diagnostic studies: none      Olivia Bonds, MD  [facility]

## 2023-10-09 NOTE — Patient Instructions (Addendum)
1. Seasonal and perennial allergic rhinitis (grasses, ragweed, weeds, trees, indoor molds, outdoor molds, dog and cockroach) - I am checking with our billing team to ensure that we are definitely out of network.  - I will text you with an update. - Continue with: Zyrtec (cetirizine) 5-10mg  as needed or Allegra on shot days.   2. Return in about 1 year (around 10/08/2024).    Please inform us of any Emergency Department visits, hospitalizations, or changes in symptoms. Call us before going to the ED for breathing or allergy symptoms since we might be able to fit you in for a sick visit. Feel free to contact us anytime with any questions, problems, or concerns.  It was a pleasure to see you again today! We love seeing you!!   Websites that have reliable patient information: 1. American Academy of Asthma, Allergy, and Immunology: www.aaaai.org 2. Food Allergy Research and Education (FARE): foodallergy.org 3. Mothers of Asthmatics: http://www.asthmacommunitynetwork.org 4. American College of Allergy, Asthma, and Immunology: www.acaai.org   COVID-19 Vaccine Information can be found at: [email or web address] For questions related to vaccine distribution or appointments, please email [email or web address] or call [phone].     "Like" [place] on Facebook and Instagram for our latest updates!       Make sure you are registered to vote! If you have moved or changed any of your contact information, you will need to get this updated before voting!  In some cases, you MAY be able to register to vote online: [email or web address]

## 2023-10-17 ENCOUNTER — Ambulatory Visit: Payer: Medicare PPO | Admitting: Neurology

## 2023-10-20 DIAGNOSIS — R7301 Impaired fasting glucose: Secondary | ICD-10-CM | POA: Diagnosis not present

## 2023-10-20 DIAGNOSIS — Z1211 Encounter for screening for malignant neoplasm of colon: Secondary | ICD-10-CM | POA: Diagnosis not present

## 2023-10-20 DIAGNOSIS — E785 Hyperlipidemia, unspecified: Secondary | ICD-10-CM | POA: Diagnosis not present

## 2023-10-20 DIAGNOSIS — Z0000 Encounter for general adult medical examination without abnormal findings: Secondary | ICD-10-CM | POA: Diagnosis not present

## 2023-11-06 DIAGNOSIS — Z1211 Encounter for screening for malignant neoplasm of colon: Secondary | ICD-10-CM | POA: Diagnosis not present

## 2023-11-11 ENCOUNTER — Ambulatory Visit (INDEPENDENT_AMBULATORY_CARE_PROVIDER_SITE_OTHER): Payer: Medicare Other | Admitting: *Deleted

## 2023-11-11 DIAGNOSIS — J309 Allergic rhinitis, unspecified: Secondary | ICD-10-CM | POA: Diagnosis not present

## 2023-11-11 MED ORDER — EPINEPHRINE 0.3 MG/0.3ML IJ SOAJ: 0.3000 mg | INTRAMUSCULAR | 1 refills | Status: AC | PRN

## 2023-11-17 DIAGNOSIS — J3081 Allergic rhinitis due to animal (cat) (dog) hair and dander: Secondary | ICD-10-CM | POA: Diagnosis not present

## 2023-11-17 NOTE — Progress Notes (Signed)
VIALS EXP 11-16-24

## 2023-11-18 DIAGNOSIS — J302 Other seasonal allergic rhinitis: Secondary | ICD-10-CM

## 2023-12-23 ENCOUNTER — Telehealth: Payer: Self-pay | Admitting: *Deleted

## 2023-12-23 ENCOUNTER — Ambulatory Visit (INDEPENDENT_AMBULATORY_CARE_PROVIDER_SITE_OTHER): Payer: Medicare Other | Admitting: *Deleted

## 2023-12-23 DIAGNOSIS — J309 Allergic rhinitis, unspecified: Secondary | ICD-10-CM | POA: Diagnosis not present

## 2023-12-23 MED ORDER — TRIAMCINOLONE ACETONIDE 0.5 % EX OINT: 1.0000 | TOPICAL_OINTMENT | Freq: Two times a day (BID) | CUTANEOUS | 5 refills | Status: DC | PRN

## 2023-12-23 NOTE — Telephone Encounter (Signed)
 Patient came in for her allergy injections and was wondering if she could get a refill of the stronger strength Triamcinolone sent to [facility] to use at the injection sites. Is this okay to refill?

## 2023-12-23 NOTE — Telephone Encounter (Signed)
I sent it in.  Thanks

## 2024-01-19 ENCOUNTER — Ambulatory Visit (INDEPENDENT_AMBULATORY_CARE_PROVIDER_SITE_OTHER)

## 2024-01-19 DIAGNOSIS — J309 Allergic rhinitis, unspecified: Secondary | ICD-10-CM | POA: Diagnosis not present

## 2024-01-26 ENCOUNTER — Ambulatory Visit (INDEPENDENT_AMBULATORY_CARE_PROVIDER_SITE_OTHER): Admitting: *Deleted

## 2024-01-26 DIAGNOSIS — J309 Allergic rhinitis, unspecified: Secondary | ICD-10-CM

## 2024-02-04 ENCOUNTER — Other Ambulatory Visit: Payer: Self-pay

## 2024-02-04 DIAGNOSIS — G5132 Clonic hemifacial spasm, left: Secondary | ICD-10-CM

## 2024-02-04 MED ORDER — BOTOX 100 UNITS IJ SOLR: INTRAMUSCULAR | 2 refills | Status: DC

## 2024-02-09 ENCOUNTER — Telehealth: Payer: Self-pay | Admitting: Pharmacy Technician

## 2024-02-09 ENCOUNTER — Other Ambulatory Visit (HOSPITAL_COMMUNITY): Payer: Self-pay

## 2024-02-09 ENCOUNTER — Telehealth: Payer: Self-pay

## 2024-02-09 NOTE — Telephone Encounter (Signed)
 Pharmacy Patient Advocate Encounter  Pharmacy Benefit PA has been submitted for Botox- J0585 via CoverMyMeds.  INSURANCE: [employer] KEY/EOC/FAXDamian Duke Procedure code 16109  PENDING require a PA Status is Pending

## 2024-02-09 NOTE — Telephone Encounter (Signed)
 I received notice this morning this patient need a PA for her upcoming Botox appointment plesase

## 2024-02-09 NOTE — Telephone Encounter (Signed)
 Pharmacy Patient Advocate Encounter   Botox One Portal verification has been Submitted Benefit Verification #:  [id number]   Primary Insurance: [employer] Dx Code: G51.32 J-code: Botox- L2440 Procedure code: 10272

## 2024-02-09 NOTE — Telephone Encounter (Signed)
 PA has been submitted, and telephone encounter has been created. Please see telephone encounter dated 4.14.25.

## 2024-02-10 ENCOUNTER — Other Ambulatory Visit (HOSPITAL_COMMUNITY): Payer: Self-pay

## 2024-02-10 NOTE — Telephone Encounter (Signed)
    Pharmacy Patient Advocate Encounter- Injection via Pharmacy Benefit:  PA was submitted  for Botox- [phone] to [facility] and has been approved through 4.15.25 TO 7.14.25 Authorization# [id number]  Please send prescription to Specialty Pharmacy: [facility]: [phone]  Estimated Pharmacy Copay is: $555  Patient IS NOT eligible for Botox- J0585 Copay Card, which will make patient's copay as little as zero. Copay card will be provided to pharmacy.   Admin Code: 95284   Does Not require Prior Auth. Patient estimated coinsurance: 0% Patient's remaining deductible: $0

## 2024-02-10 NOTE — Telephone Encounter (Signed)
 Pharmacy Patient Advocate Encounter   Botox One Portal verification has been Completed Benefit Verification #:  [id number]   Primary Insurance: [employer] Dx Code: G51.32 J-code: Botox- U9811 Procedure code: 91478

## 2024-02-11 ENCOUNTER — Telehealth: Payer: Self-pay | Admitting: Neurology

## 2024-02-11 ENCOUNTER — Telehealth: Payer: Self-pay | Admitting: Pharmacy Technician

## 2024-02-11 ENCOUNTER — Other Ambulatory Visit (HOSPITAL_COMMUNITY): Payer: Self-pay

## 2024-02-11 NOTE — Telephone Encounter (Signed)
 Pharmacy Patient Advocate Encounter  Pharmacy Benefit PA has been submitted for Botox- J0585 via CoverMyMeds.  INSURANCE: [facility] KEY/EOC/FAX: [id number] Procedure code 86578  Does Not require a PA Status is Pending

## 2024-02-11 NOTE — Telephone Encounter (Signed)
 Pt called in stating she found out she was going to have a large amount due for her botox. She says she has a second insurance. I have added [employer] to the system. She stated [employer] is who needs to be contacted for that insurance.

## 2024-02-12 ENCOUNTER — Other Ambulatory Visit: Payer: Self-pay

## 2024-02-12 ENCOUNTER — Other Ambulatory Visit (HOSPITAL_COMMUNITY): Payer: Self-pay

## 2024-02-12 DIAGNOSIS — G5132 Clonic hemifacial spasm, left: Secondary | ICD-10-CM

## 2024-02-12 MED ORDER — BOTOX 100 UNITS IJ SOLR: INTRAMUSCULAR | 2 refills | Status: DC

## 2024-02-12 NOTE — Telephone Encounter (Signed)
 RX has been sent to [facility]

## 2024-02-12 NOTE — Telephone Encounter (Signed)
 Pharmacy Patient Advocate Encounter- Injection via Pharmacy Benefit:  PA was submitted  for Botox- [phone] to [facility] and has been approved through: 4.17.25 to 4.17.26 Authorization# [id number]  Please send prescription to Specialty Pharmacy: [facility]: [phone] Estimated Pharmacy Copay is: $555  Patient IS NOT eligible for Botox- J0585 Copay Card, which will make patient's copay as little as zero. Copay card will be provided to pharmacy.   Admin Code: 10932   Does Not require Prior Auth. Patient estimated coinsurance: 20% Patient's remaining deductible: $95.29

## 2024-02-16 ENCOUNTER — Other Ambulatory Visit: Payer: Self-pay

## 2024-02-16 ENCOUNTER — Ambulatory Visit (INDEPENDENT_AMBULATORY_CARE_PROVIDER_SITE_OTHER)

## 2024-02-16 DIAGNOSIS — G5132 Clonic hemifacial spasm, left: Secondary | ICD-10-CM

## 2024-02-16 DIAGNOSIS — J309 Allergic rhinitis, unspecified: Secondary | ICD-10-CM

## 2024-02-16 MED ORDER — BOTOX 100 UNITS IJ SOLR: INTRAMUSCULAR | 2 refills | Status: DC

## 2024-02-16 NOTE — Telephone Encounter (Signed)
 Pt copay of $555 still returned the same with either insurance.

## 2024-02-16 NOTE — Telephone Encounter (Signed)
 Called pateint regarding her high deductible and if she wanted to try Xeomin  in the future foir a less expense

## 2024-02-17 ENCOUNTER — Other Ambulatory Visit: Payer: Self-pay

## 2024-02-17 DIAGNOSIS — G5132 Clonic hemifacial spasm, left: Secondary | ICD-10-CM

## 2024-02-17 MED ORDER — BOTOX 100 UNITS IJ SOLR: INTRAMUSCULAR | 2 refills | Status: DC

## 2024-02-17 NOTE — Telephone Encounter (Signed)
 Received notification to send to [facility] for this patients Botox . I have done that today

## 2024-02-18 ENCOUNTER — Telehealth: Payer: Self-pay

## 2024-02-18 ENCOUNTER — Other Ambulatory Visit: Payer: Self-pay

## 2024-02-18 DIAGNOSIS — G5132 Clonic hemifacial spasm, left: Secondary | ICD-10-CM

## 2024-02-18 MED ORDER — XEOMIN 50 UNITS IM SOLR: INTRAMUSCULAR | 0 refills | Status: DC

## 2024-02-18 NOTE — Telephone Encounter (Signed)
 Patient has a high copay with Botox  Dr. Winferd Hatter would like to try Xeomin  50 units Patient will need a new PA for this med please

## 2024-02-19 ENCOUNTER — Telehealth: Payer: Self-pay | Admitting: Pharmacy Technician

## 2024-02-19 ENCOUNTER — Other Ambulatory Visit (HOSPITAL_COMMUNITY): Payer: Self-pay

## 2024-02-19 NOTE — Telephone Encounter (Signed)
 Pharmacy Patient Advocate Encounter   Pharmacy Benefit PA has been submitted for Xeomin - [phone] via CoverMyMeds.  INSURANCE: [facility]FAX: [id number] Procedure code 86578   PENDING  require a PA Status is Pending

## 2024-02-19 NOTE — Telephone Encounter (Signed)
 Pharmacy Patient Advocate Encounter  Pharmacy Benefit PA has been submitted for Xeomin - [phone] via CoverMyMeds.  INSURANCE: [facility] KEY/EOC/FAX: [id number] Procedure code 56213   PENDING  require a PA Status is Pending

## 2024-02-19 NOTE — Telephone Encounter (Signed)
 PA has been submitted, and telephone encounter has been created. Please see telephone encounter dated 4.24.25.  PA submitted to both insurances.

## 2024-02-20 ENCOUNTER — Ambulatory Visit: Payer: Medicare PPO | Admitting: Neurology

## 2024-02-20 ENCOUNTER — Other Ambulatory Visit (HOSPITAL_COMMUNITY): Payer: Self-pay

## 2024-02-20 NOTE — Telephone Encounter (Signed)
 The PA has been approved for the medication (see encounter). I am asking what procedure code should be used. [id number], [id number], [id number].Aaron Aas...which should I use to verify if a pre-cert/PA is needed for it

## 2024-02-20 NOTE — Telephone Encounter (Signed)
 Pharmacy Patient Advocate Encounter- Injection via Pharmacy Benefit:  PA was submitted  for Xeomin - [phone] to [employer] and has been approved through: 4.24.25 to 7.24.25 Authorization# [id number]  Please send prescription to Specialty Pharmacy: [facility]: [phone]  Estimated Pharmacy Copay is: $167.48  Patient IS NOT eligible for Xeomin - [id number] Copay Card, which will make patient's copay as little as zero. Copay card will be provided to pharmacy.   Admin Code: 57846    PENDING  require Prior Auth. Patient estimated coinsurance: - Patient's remaining deductible: -

## 2024-02-20 NOTE — Telephone Encounter (Signed)
 Cool beans. Please include the the diagnosis and the procedure code to be used whenever requesting. This will help ensure I am checking for the correct procedure code that will be used at the visit. Thanks ma'am

## 2024-02-20 NOTE — Telephone Encounter (Signed)
 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

## 2024-02-20 NOTE — Telephone Encounter (Signed)
 Pharmacy Patient Advocate Encounter- Injection via Pharmacy Benefit:  PA was submitted  for Xeomin - [phone] to [facility] and has been approved through: 4.24.25 to 4.24.26 Authorization# [id number]  Please send prescription to Specialty Pharmacy: [facility]: [phone]  Estimated Pharmacy Copay is: $167.48  Patient is not  eligible for Xeomin - H0865 Copay Card, which will make patient's copay as little as zero. Copay card will be provided to pharmacy.   Admin Code: 78469  pending  require Prior Auth.

## 2024-02-23 ENCOUNTER — Ambulatory Visit (INDEPENDENT_AMBULATORY_CARE_PROVIDER_SITE_OTHER)

## 2024-02-23 DIAGNOSIS — J309 Allergic rhinitis, unspecified: Secondary | ICD-10-CM | POA: Diagnosis not present

## 2024-03-02 ENCOUNTER — Telehealth: Payer: Self-pay | Admitting: Neurology

## 2024-03-02 NOTE — Telephone Encounter (Signed)
 Pt called in and left a message. She is wanting to find out what all is left to do before she can get an appointment for Xeomen?

## 2024-03-11 DIAGNOSIS — H35373 Puckering of macula, bilateral: Secondary | ICD-10-CM | POA: Diagnosis not present

## 2024-03-25 ENCOUNTER — Ambulatory Visit (INDEPENDENT_AMBULATORY_CARE_PROVIDER_SITE_OTHER)

## 2024-03-25 DIAGNOSIS — J309 Allergic rhinitis, unspecified: Secondary | ICD-10-CM

## 2024-04-05 ENCOUNTER — Other Ambulatory Visit: Payer: Self-pay

## 2024-04-05 DIAGNOSIS — G5132 Clonic hemifacial spasm, left: Secondary | ICD-10-CM

## 2024-04-05 MED ORDER — XEOMIN 50 UNITS IM SOLR: INTRAMUSCULAR | 0 refills | Status: DC

## 2024-04-08 ENCOUNTER — Other Ambulatory Visit: Payer: Self-pay

## 2024-04-08 DIAGNOSIS — G5132 Clonic hemifacial spasm, left: Secondary | ICD-10-CM

## 2024-04-08 MED ORDER — XEOMIN 50 UNITS IM SOLR: INTRAMUSCULAR | 0 refills | Status: DC

## 2024-04-09 ENCOUNTER — Ambulatory Visit (INDEPENDENT_AMBULATORY_CARE_PROVIDER_SITE_OTHER): Admitting: Neurology

## 2024-04-09 DIAGNOSIS — G245 Blepharospasm: Secondary | ICD-10-CM

## 2024-04-09 DIAGNOSIS — G5132 Clonic hemifacial spasm, left: Secondary | ICD-10-CM | POA: Diagnosis not present

## 2024-04-09 MED ORDER — INCOBOTULINUMTOXINA 50 UNITS IM SOLR
50.0000 [IU] | INTRAMUSCULAR | Status: AC
  Administered 2024-04-09: 20 [IU] via INTRAMUSCULAR

## 2024-04-09 NOTE — Addendum Note (Signed)
 Addended by: Ortencia Blamer C on: 04/09/2024 03:18 PM   Modules accepted: Orders

## 2024-04-09 NOTE — Procedures (Signed)
 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  Risk discussed included, but were not limited pain and discomfort, bleeding, bruising, excessive weakness, venous thrombosis, muscle atrophy and dysphagia.  A copy of the patient medication guide was given to the patient which explains the blackbox warning.  Patients identity and treatment sites confirmed Yes.  .  Details of Procedure: Skin was cleaned with alcohol.   Prior to injection, the needle plunger was aspirated to make sure the needle was not within a blood vessel.  There was no blood retrieved on aspiration.    Following is a summary of the muscles injected  And the amount of incobotulinumtoxin A used:  Injections  Location Left  Right Units Number of sites        Corrugator      Frontalis      Lower Lid, Lateral 2.5  2.5 1  Lower Lid Medial      Upper Lid, Lateral 2.5  2.5 1  Upper Lid, Medial      Canthus 2.5 2.5 5.0 1 each side  Temporalis      nasalis      Procerus   5.0   Zygomaticus Major 2.5/2.5  5.0   TOTAL UNITS:   20    Agent: Incobotulinumtoxin A, 50 unit vial, diluted with sterile, non-preserved saline to make 5 units/0.1cc.   Total injected (Units): 20  Total wasted (Units):30 Pt tolerated procedure well without complications.   Reinjection is anticipated in 3 months.

## 2024-04-16 ENCOUNTER — Other Ambulatory Visit: Payer: Self-pay | Admitting: Neurology

## 2024-04-16 DIAGNOSIS — G5132 Clonic hemifacial spasm, left: Secondary | ICD-10-CM

## 2024-05-21 ENCOUNTER — Ambulatory Visit: Admitting: Neurology

## 2024-05-31 ENCOUNTER — Ambulatory Visit (INDEPENDENT_AMBULATORY_CARE_PROVIDER_SITE_OTHER)

## 2024-05-31 DIAGNOSIS — J309 Allergic rhinitis, unspecified: Secondary | ICD-10-CM | POA: Diagnosis not present

## 2024-06-07 ENCOUNTER — Ambulatory Visit (INDEPENDENT_AMBULATORY_CARE_PROVIDER_SITE_OTHER)

## 2024-06-07 DIAGNOSIS — J309 Allergic rhinitis, unspecified: Secondary | ICD-10-CM | POA: Diagnosis not present

## 2024-06-15 ENCOUNTER — Ambulatory Visit (INDEPENDENT_AMBULATORY_CARE_PROVIDER_SITE_OTHER)

## 2024-06-15 DIAGNOSIS — J309 Allergic rhinitis, unspecified: Secondary | ICD-10-CM | POA: Diagnosis not present

## 2024-06-18 ENCOUNTER — Ambulatory Visit: Admitting: Neurology

## 2024-06-21 ENCOUNTER — Ambulatory Visit (INDEPENDENT_AMBULATORY_CARE_PROVIDER_SITE_OTHER)

## 2024-06-21 DIAGNOSIS — J309 Allergic rhinitis, unspecified: Secondary | ICD-10-CM | POA: Diagnosis not present

## 2024-07-09 ENCOUNTER — Ambulatory Visit: Admitting: Neurology

## 2024-07-21 ENCOUNTER — Ambulatory Visit (INDEPENDENT_AMBULATORY_CARE_PROVIDER_SITE_OTHER)

## 2024-07-21 DIAGNOSIS — H2513 Age-related nuclear cataract, bilateral: Secondary | ICD-10-CM | POA: Diagnosis not present

## 2024-07-21 DIAGNOSIS — J309 Allergic rhinitis, unspecified: Secondary | ICD-10-CM

## 2024-07-21 DIAGNOSIS — H35373 Puckering of macula, bilateral: Secondary | ICD-10-CM | POA: Diagnosis not present

## 2024-07-21 DIAGNOSIS — H35341 Macular cyst, hole, or pseudohole, right eye: Secondary | ICD-10-CM | POA: Diagnosis not present

## 2024-07-21 DIAGNOSIS — H43813 Vitreous degeneration, bilateral: Secondary | ICD-10-CM | POA: Diagnosis not present

## 2024-07-26 ENCOUNTER — Telehealth: Payer: Self-pay

## 2024-07-26 ENCOUNTER — Other Ambulatory Visit (HOSPITAL_COMMUNITY): Payer: Self-pay

## 2024-07-26 ENCOUNTER — Telehealth: Payer: Self-pay | Admitting: Pharmacy Technician

## 2024-07-26 NOTE — Telephone Encounter (Signed)
 Patient needs an updated PA for her [facility] Medicare for 50 units of Xeomin  [phone]

## 2024-07-26 NOTE — Telephone Encounter (Signed)
 Pharmacy Patient Advocate Encounter   Received notification from Pt Calls Messages that prior authorization for XEOMIN  50 is required/requested.   Insurance verification completed.   The patient is insured through [facility] .   Per test claim: PA required; PA submitted to above mentioned insurance via Latent Key/confirmation #/EOC [id number] Status is pending

## 2024-07-26 NOTE — Telephone Encounter (Signed)
 PA has been submitted, and telephone encounter has been created. Please see telephone encounter dated 9.29.25.

## 2024-07-28 ENCOUNTER — Other Ambulatory Visit (HOSPITAL_COMMUNITY): Payer: Self-pay

## 2024-07-28 NOTE — Telephone Encounter (Signed)
 Pharmacy Patient Advocate Encounter  Received notification from [employer] that Prior Authorization for XEOMIN  50 has been APPROVED from 9.29.25 to 12.29.25. Unable to obtain price due to refill too soon rejection, last fill date 9.29.25 next available fill date12.6.25   PA #/Case ID/Reference #: [id number]

## 2024-08-02 ENCOUNTER — Other Ambulatory Visit (HOSPITAL_COMMUNITY): Payer: Self-pay

## 2024-08-03 ENCOUNTER — Telehealth: Payer: Self-pay | Admitting: Neurology

## 2024-08-03 NOTE — Telephone Encounter (Signed)
 Specialty pharmacy calling with no details

## 2024-08-04 NOTE — Telephone Encounter (Signed)
 Called and set up delivery for 08/09/2024

## 2024-08-19 ENCOUNTER — Ambulatory Visit

## 2024-08-19 ENCOUNTER — Telehealth: Payer: Self-pay | Admitting: Neurology

## 2024-08-19 DIAGNOSIS — J309 Allergic rhinitis, unspecified: Secondary | ICD-10-CM | POA: Diagnosis not present

## 2024-08-19 NOTE — Telephone Encounter (Signed)
 Pt called in this morning, and she wants to know can she go ahead and get her Allergy  shot today. She does not want to do anything that will mess her up for getting Botox . Thanks

## 2024-08-20 ENCOUNTER — Ambulatory Visit (INDEPENDENT_AMBULATORY_CARE_PROVIDER_SITE_OTHER): Admitting: Neurology

## 2024-08-20 DIAGNOSIS — G245 Blepharospasm: Secondary | ICD-10-CM

## 2024-08-20 DIAGNOSIS — G5132 Clonic hemifacial spasm, left: Secondary | ICD-10-CM

## 2024-08-20 MED ORDER — INCOBOTULINUMTOXINA 50 UNITS IM SOLR
50.0000 [IU] | INTRAMUSCULAR | Status: AC
  Administered 2024-08-20: 20 [IU] via INTRAMUSCULAR

## 2024-08-20 NOTE — Addendum Note (Signed)
 Addended by: MERCER CREE C on: 08/20/2024 01:46 PM   Modules accepted: Orders

## 2024-08-20 NOTE — Procedures (Signed)
 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  Risk discussed included, but were not limited pain and discomfort, bleeding, bruising, excessive weakness, venous thrombosis, muscle atrophy and dysphagia.  A copy of the patient medication guide was given to the patient which explains the blackbox warning.  Patients identity and treatment sites confirmed Yes.  .  Details of Procedure: Skin was cleaned with alcohol.   Prior to injection, the needle plunger was aspirated to make sure the needle was not within a blood vessel.  There was no blood retrieved on aspiration.    Following is a summary of the muscles injected  And the amount of incobotulinumtoxin A used:  Injections  Location Left  Right Units Number of sites        Corrugator      Frontalis      Lower Lid, Lateral 2.5  2.5 1  Lower Lid Medial      Upper Lid, Lateral 2.5  2.5 1  Upper Lid, Medial      Canthus 2.5 2.5 5.0 1 each side  Temporalis      nasalis      Procerus   5.0   Zygomaticus Major 2.5/2.5  5.0   TOTAL UNITS:   20    Agent: Incobotulinumtoxin A, 50 unit vial, diluted with sterile, non-preserved saline to make 5 units/0.1cc.   Total injected (Units): 20  Total wasted (Units):30 Pt tolerated procedure well without complications.   Reinjection is anticipated in 3 months.

## 2024-09-02 DIAGNOSIS — J3081 Allergic rhinitis due to animal (cat) (dog) hair and dander: Secondary | ICD-10-CM | POA: Diagnosis not present

## 2024-09-02 DIAGNOSIS — J301 Allergic rhinitis due to pollen: Secondary | ICD-10-CM | POA: Diagnosis not present

## 2024-09-02 NOTE — Progress Notes (Signed)
 VIALS MADE ON 09/02/24

## 2024-09-03 DIAGNOSIS — J302 Other seasonal allergic rhinitis: Secondary | ICD-10-CM | POA: Diagnosis not present

## 2024-09-03 DIAGNOSIS — J301 Allergic rhinitis due to pollen: Secondary | ICD-10-CM | POA: Diagnosis not present

## 2024-09-16 ENCOUNTER — Ambulatory Visit

## 2024-09-16 DIAGNOSIS — J3089 Other allergic rhinitis: Secondary | ICD-10-CM

## 2024-09-16 DIAGNOSIS — J309 Allergic rhinitis, unspecified: Secondary | ICD-10-CM

## 2024-09-30 ENCOUNTER — Ambulatory Visit: Admitting: Allergy & Immunology

## 2024-10-07 ENCOUNTER — Encounter: Payer: Self-pay | Admitting: Allergy & Immunology

## 2024-10-07 ENCOUNTER — Ambulatory Visit (INDEPENDENT_AMBULATORY_CARE_PROVIDER_SITE_OTHER): Admitting: *Deleted

## 2024-10-07 ENCOUNTER — Ambulatory Visit (INDEPENDENT_AMBULATORY_CARE_PROVIDER_SITE_OTHER): Admitting: Allergy & Immunology

## 2024-10-07 ENCOUNTER — Other Ambulatory Visit: Payer: Self-pay

## 2024-10-07 VITALS — BP 118/82 | HR 86 | Temp 97.9°F | Ht 66.25 in | Wt 149.4 lb

## 2024-10-07 DIAGNOSIS — L2089 Other atopic dermatitis: Secondary | ICD-10-CM

## 2024-10-07 DIAGNOSIS — J302 Other seasonal allergic rhinitis: Secondary | ICD-10-CM | POA: Diagnosis not present

## 2024-10-07 DIAGNOSIS — J3089 Other allergic rhinitis: Secondary | ICD-10-CM

## 2024-10-07 DIAGNOSIS — J309 Allergic rhinitis, unspecified: Secondary | ICD-10-CM

## 2024-10-07 MED ORDER — TRIAMCINOLONE ACETONIDE 0.1 % EX CREA: 1.0000 | TOPICAL_CREAM | Freq: Two times a day (BID) | CUTANEOUS | 5 refills | Status: DC

## 2024-10-07 NOTE — Progress Notes (Signed)
 FOLLOW UP  Date of Service/Encounter:  10/07/2024   Assessment:   Seasonal and perennial allergic rhinitis (grasses, ragweed, weeds, trees, indoor molds, outdoor molds, dog and cockroach) - with maintenance reached March 2021, now stopping after the current vial   Large local reactions - resolved   Eczema - on the scalp and the lower back   Plan/Recommendations:   1. Seasonal and perennial allergic rhinitis (grasses, ragweed, weeds, trees, indoor molds, outdoor molds, dog and cockroach) - Form signed to discontinue allergy  shots.  - We will use up the vial that is made.  - Continue with: Zyrtec (cetirizine) 5-10mg  as needed or Allegra  on shot days.   2. Return in about 1 year (around 10/07/2025). You can have the follow up appointment with Dr. Iva or a Nurse Practicioner (our Nurse Practitioners are excellent and always have Physician oversight!).   Subjective:   Olivia Cole is a 68 y.o. female presenting today for follow up of  Chief Complaint  Patient presents with   Allergic Rhinitis     Doing good.    Pruritus    Feel like she is itching all over...across neck for a few years now middle of her back.     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  Discussed the use of AI scribe software for clinical note transcription with the patient and/or guardian, who gave verbal consent to proceed.  Olivia Cole is a 68 y.o. female presenting for a follow up visit.  She was last seen in December 2024.  At that time, we continue with Zyrtec as well as her allergy  shots.  Since the last visit, she has done well.  Allergic Rhinitis Symptom History: She has been receiving allergy  shots since March 2021. She does not believe the shots  exacerbate her symptoms, and her overall symptoms have improved except for the rash.  She experiences daily itching, including itchy eyes, which she notes is different from her usual dry eyes. The itching sometimes worsens with physical activity, such as playing tennis, and she suspects it may be related to sweat and clothing friction.   Olivia Cole is on allergen immunotherapy. She receives two injections. Immunotherapy script #1 contains trees, weeds, grasses and dog. She currently receives 0.50mL of the RED vial (1/100). Immunotherapy script #2 contains ragweed, molds and cockroach. She currently receives 0.50mL of the RED vial (1/100). She started shots November of 2020 and reached maintenance in March 2021. overall her shots have proven very helpful and she is doing quite a bit better with her symptoms.   Skin Symptom History: She experiences persistent itching and rash that began as a small scratch on her neck and has since spread down her back. She feels the presence of the rash, and her husband has noticed scratches. She describes a bump in the middle of her back that she felt about a week or two ago. She has been using triamcinolone  cream, which provides temporary relief but does not completely resolve the symptoms. Additionally, she has tried natural remedies such as aloe vera gel and witch hazel without success. She moisturizes aggressively after showering to manage her symptoms.  Otherwise, there have been no changes to her past medical history, surgical history, family history, or social history.    Review  of systems otherwise negative other than that mentioned in the HPI.    Objective:   Blood pressure 118/82, pulse 86, temperature 97.9 F (36.6 C), height 5' 6.25 (1.683 m), weight 149 lb 6.4 oz (67.8 kg), SpO2 98%. Body mass index is 23.93 kg/m.    Physical Exam Vitals reviewed.  Constitutional:      Appearance: She is well-developed.     Comments: Pleasant female. Cooperative  with the exam. Talkative.   HENT:     Head: Normocephalic and atraumatic.     Right Ear: Tympanic membrane, ear canal and external ear normal.     Left Ear: Tympanic membrane, ear canal and external ear normal.     Nose: No nasal deformity, septal deviation, mucosal edema or rhinorrhea.     Right Turbinates: Enlarged, swollen and pale.     Left Turbinates: Enlarged, swollen and pale.     Right Sinus: No maxillary sinus tenderness or frontal sinus tenderness.     Left Sinus: No maxillary sinus tenderness or frontal sinus tenderness.     Comments: Scant clear rhinorrhea.    Mouth/Throat:     Mouth: Mucous membranes are not pale and not dry.     Pharynx: Uvula midline.     Comments: There is some cobblestoning present.  Eyes:     General: Lids are normal. Allergic shiner present.        Right eye: No discharge.        Left eye: No discharge.     Conjunctiva/sclera: Conjunctivae normal.     Right eye: Right conjunctiva is not injected. No chemosis.    Left eye: Left conjunctiva is not injected. No chemosis.    Pupils: Pupils are equal, round, and reactive to light.  Cardiovascular:     Rate and Rhythm: Normal rate and regular rhythm.     Heart sounds: Normal heart sounds.  Pulmonary:     Effort: Pulmonary effort is normal. No tachypnea, accessory muscle usage or respiratory distress.     Breath sounds: Normal breath sounds. No wheezing, rhonchi or rales.     Comments: Moving air well in all lung fields. No increased work of breathing noted.  Chest:     Chest wall: No tenderness.  Lymphadenopathy:     Cervical: No cervical adenopathy.  Skin:    General: Skin is warm.     Capillary Refill: Capillary refill takes less than 2 seconds.     Findings: No abrasion, erythema, petechiae or rash. Rash is not papular, urticarial or vesicular.  Neurological:     Mental Status: She is alert.  Psychiatric:        Behavior: Behavior is cooperative.      Diagnostic studies: none        Marty Shaggy, MD  [facility] 

## 2024-10-07 NOTE — Patient Instructions (Addendum)
 1. Seasonal and perennial allergic rhinitis (grasses, ragweed, weeds, trees, indoor molds, outdoor molds, dog and cockroach) - Form signed to discontinue allergy  shots.  - We will use up the vial that is made.  - Continue with: Zyrtec (cetirizine) 5-10mg  as needed or Allegra  on shot days.   2. Return in about 1 year (around 10/07/2025). You can have the follow up appointment with Dr. Iva or a Nurse Practicioner (our Nurse Practitioners are excellent and always have Physician oversight!).    Please inform us  of any Emergency Department visits, hospitalizations, or changes in symptoms. Call us  before going to the ED for breathing or allergy  symptoms since we might be able to fit you in for a sick visit. Feel free to contact us  anytime with any questions, problems, or concerns.  It was a pleasure to see you and your family again today!  Websites that have reliable patient information: 1. American Academy of Asthma, Allergy , and Immunology: www.aaaai.org 2. Food Allergy  Research and Education (FARE): foodallergy.org 3. Mothers of Asthmatics: http://www.asthmacommunitynetwork.org 4. American College of Allergy , Asthma, and Immunology: www.acaai.org      Like us  on [employer] and Instagram for our latest updates!      A healthy democracy works best when [employer] participate! Make sure you are registered to vote! If you have moved or changed any of your contact information, you will need to get this updated before voting! Scan the QR codes below to learn more!

## 2024-10-18 ENCOUNTER — Ambulatory Visit

## 2024-10-18 DIAGNOSIS — J302 Other seasonal allergic rhinitis: Secondary | ICD-10-CM | POA: Diagnosis not present

## 2024-10-18 DIAGNOSIS — J309 Allergic rhinitis, unspecified: Secondary | ICD-10-CM

## 2024-10-25 ENCOUNTER — Ambulatory Visit

## 2024-10-25 DIAGNOSIS — J302 Other seasonal allergic rhinitis: Secondary | ICD-10-CM | POA: Diagnosis not present

## 2024-10-25 DIAGNOSIS — J309 Allergic rhinitis, unspecified: Secondary | ICD-10-CM

## 2024-11-19 ENCOUNTER — Ambulatory Visit: Admitting: Neurology

## 2024-11-30 ENCOUNTER — Ambulatory Visit

## 2024-11-30 DIAGNOSIS — J302 Other seasonal allergic rhinitis: Secondary | ICD-10-CM

## 2024-11-30 DIAGNOSIS — L2089 Other atopic dermatitis: Secondary | ICD-10-CM

## 2024-11-30 MED ORDER — TRIAMCINOLONE ACETONIDE 0.5 % EX OINT: 1.0000 | TOPICAL_OINTMENT | Freq: Two times a day (BID) | CUTANEOUS | 0 refills | Status: AC

## 2024-12-03 ENCOUNTER — Ambulatory Visit: Admitting: Neurology

## 2024-12-03 DIAGNOSIS — G5132 Clonic hemifacial spasm, left: Secondary | ICD-10-CM

## 2024-12-03 MED ORDER — INCOBOTULINUMTOXINA 50 UNITS IM SOLR
50.0000 [IU] | INTRAMUSCULAR | Status: AC
  Administered 2024-12-03: 20 [IU] via INTRAMUSCULAR

## 2024-12-03 NOTE — Procedures (Signed)
 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  Risk discussed included, but were not limited pain and discomfort, bleeding, bruising, excessive weakness, venous thrombosis, muscle atrophy and dysphagia.  A copy of the patient medication guide was given to the patient which explains the blackbox warning.  Patients identity and treatment sites confirmed Yes.  .  Details of Procedure: Skin was cleaned with alcohol.   Prior to injection, the needle plunger was aspirated to make sure the needle was not within a blood vessel.  There was no blood retrieved on aspiration.    Following is a summary of the muscles injected  And the amount of incobotulinumtoxin A used:  Injections  Location Left  Right Units Number of sites        Corrugator      Frontalis      Lower Lid, Lateral 2.5  2.5 1  Lower Lid Medial      Upper Lid, Lateral 2.5  2.5 1  Upper Lid, Medial      Canthus 2.5 2.5 5.0 1 each side  Temporalis      nasalis      Procerus   5.0   Zygomaticus Major 2.5/2.5  5.0   TOTAL UNITS:   20    Agent: Incobotulinumtoxin A, 50 unit vial, diluted with sterile, non-preserved saline to make 5 units/0.1cc.   Total injected (Units): 20  Total wasted (Units):30 Pt tolerated procedure well without complications.   Reinjection is anticipated in 3 months.

## 2025-03-04 ENCOUNTER — Ambulatory Visit: Payer: Self-pay | Admitting: Neurology

## 2025-04-08 ENCOUNTER — Ambulatory Visit: Admitting: Neurology

## 2025-10-11 ENCOUNTER — Ambulatory Visit: Admitting: Allergy & Immunology
